# Patient Record
Sex: Female | Born: 1980
Health system: Southern US, Community
[De-identification: ages and names within clinical notes are randomized; demographics above are authoritative.]

## PROBLEM LIST (undated history)

## (undated) DIAGNOSIS — B009 Herpesviral infection, unspecified: Secondary | ICD-10-CM

## (undated) DIAGNOSIS — L309 Dermatitis, unspecified: Secondary | ICD-10-CM

## (undated) DIAGNOSIS — B999 Unspecified infectious disease: Secondary | ICD-10-CM

## (undated) DIAGNOSIS — Z9851 Tubal ligation status: Secondary | ICD-10-CM

## (undated) DIAGNOSIS — Z3483 Encounter for supervision of other normal pregnancy, third trimester: Secondary | ICD-10-CM

## (undated) HISTORY — PX: THERAPEUTIC ABORTION: SHX798

---

## 1998-01-13 ENCOUNTER — Ambulatory Visit (HOSPITAL_COMMUNITY): Admission: RE | Admit: 1998-01-13 | Discharge: 1998-01-13 | Payer: Self-pay | Admitting: *Deleted

## 1998-06-01 ENCOUNTER — Inpatient Hospital Stay (HOSPITAL_COMMUNITY): Admission: AD | Admit: 1998-06-01 | Discharge: 1998-06-03 | Payer: Self-pay | Admitting: Obstetrics and Gynecology

## 1999-01-28 ENCOUNTER — Emergency Department (HOSPITAL_COMMUNITY): Admission: EM | Admit: 1999-01-28 | Discharge: 1999-01-28 | Payer: Self-pay | Admitting: Emergency Medicine

## 2000-02-17 ENCOUNTER — Emergency Department (HOSPITAL_COMMUNITY): Admission: EM | Admit: 2000-02-17 | Discharge: 2000-02-17 | Payer: Self-pay

## 2000-04-28 ENCOUNTER — Encounter (INDEPENDENT_AMBULATORY_CARE_PROVIDER_SITE_OTHER): Payer: Self-pay

## 2000-04-28 ENCOUNTER — Other Ambulatory Visit: Admission: RE | Admit: 2000-04-28 | Discharge: 2000-04-28 | Payer: Self-pay | Admitting: Obstetrics

## 2001-11-20 ENCOUNTER — Emergency Department (HOSPITAL_COMMUNITY): Admission: EM | Admit: 2001-11-20 | Discharge: 2001-11-20 | Payer: Self-pay | Admitting: Emergency Medicine

## 2001-12-25 ENCOUNTER — Emergency Department (HOSPITAL_COMMUNITY): Admission: EM | Admit: 2001-12-25 | Discharge: 2001-12-25 | Payer: Self-pay | Admitting: Emergency Medicine

## 2001-12-25 ENCOUNTER — Encounter: Payer: Self-pay | Admitting: Emergency Medicine

## 2001-12-29 ENCOUNTER — Emergency Department (HOSPITAL_COMMUNITY): Admission: EM | Admit: 2001-12-29 | Discharge: 2001-12-29 | Payer: Self-pay | Admitting: Emergency Medicine

## 2003-06-02 ENCOUNTER — Inpatient Hospital Stay (HOSPITAL_COMMUNITY): Admission: AD | Admit: 2003-06-02 | Discharge: 2003-06-02 | Payer: Self-pay | Admitting: Obstetrics & Gynecology

## 2003-08-14 ENCOUNTER — Inpatient Hospital Stay (HOSPITAL_COMMUNITY): Admission: AD | Admit: 2003-08-14 | Discharge: 2003-08-14 | Payer: Self-pay | Admitting: *Deleted

## 2004-08-06 ENCOUNTER — Other Ambulatory Visit: Admission: RE | Admit: 2004-08-06 | Discharge: 2004-08-06 | Payer: Self-pay | Admitting: Obstetrics and Gynecology

## 2004-11-27 ENCOUNTER — Ambulatory Visit (HOSPITAL_COMMUNITY): Admission: RE | Admit: 2004-11-27 | Discharge: 2004-11-27 | Payer: Self-pay | Admitting: Obstetrics and Gynecology

## 2004-11-29 ENCOUNTER — Inpatient Hospital Stay (HOSPITAL_COMMUNITY): Admission: AD | Admit: 2004-11-29 | Discharge: 2004-11-29 | Payer: Self-pay | Admitting: Obstetrics and Gynecology

## 2004-12-02 ENCOUNTER — Inpatient Hospital Stay: Admission: AD | Admit: 2004-12-02 | Discharge: 2004-12-02 | Payer: Self-pay | Admitting: Obstetrics and Gynecology

## 2005-01-05 ENCOUNTER — Inpatient Hospital Stay (HOSPITAL_COMMUNITY): Admission: AD | Admit: 2005-01-05 | Discharge: 2005-01-05 | Payer: Self-pay | Admitting: Obstetrics and Gynecology

## 2005-04-11 ENCOUNTER — Inpatient Hospital Stay (HOSPITAL_COMMUNITY): Admission: AD | Admit: 2005-04-11 | Discharge: 2005-04-14 | Payer: Self-pay | Admitting: Obstetrics & Gynecology

## 2005-10-09 ENCOUNTER — Emergency Department (HOSPITAL_COMMUNITY): Admission: EM | Admit: 2005-10-09 | Discharge: 2005-10-09 | Payer: Self-pay | Admitting: Family Medicine

## 2006-11-17 IMAGING — US US OB COMP +14 WK
1 series · 13 of 28 positions shown · non-contrast
Comparison: none

CLINICAL DATA: 19 week 0 day gestational age by LMP.  Evaluate dating and anatomy.

[Series 1: us ob comp +14 wk · 0.31mm/px · 13 of 80 slices shown]
[im 3/80]
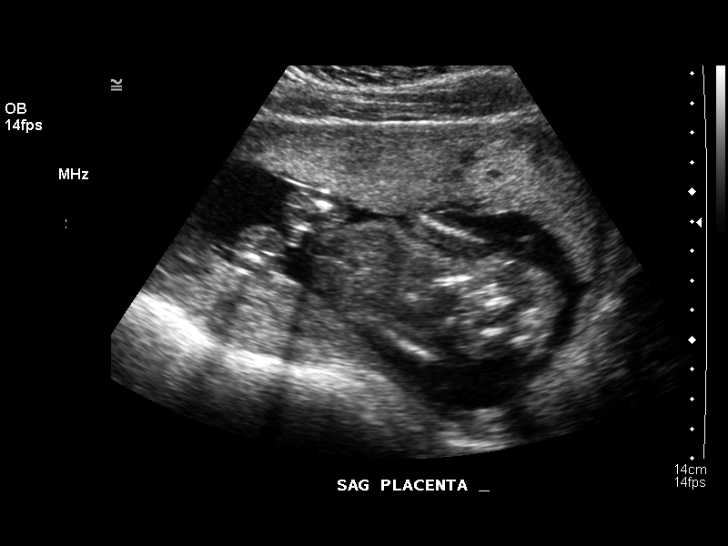
[im 9/80]
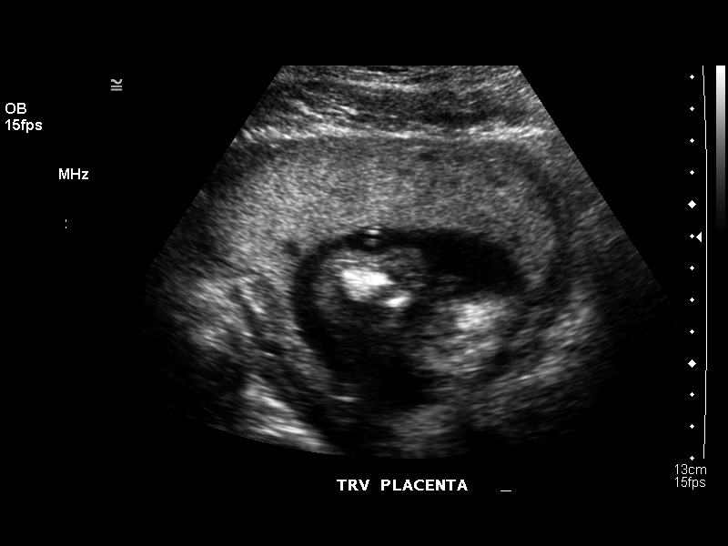
[im 15/80]
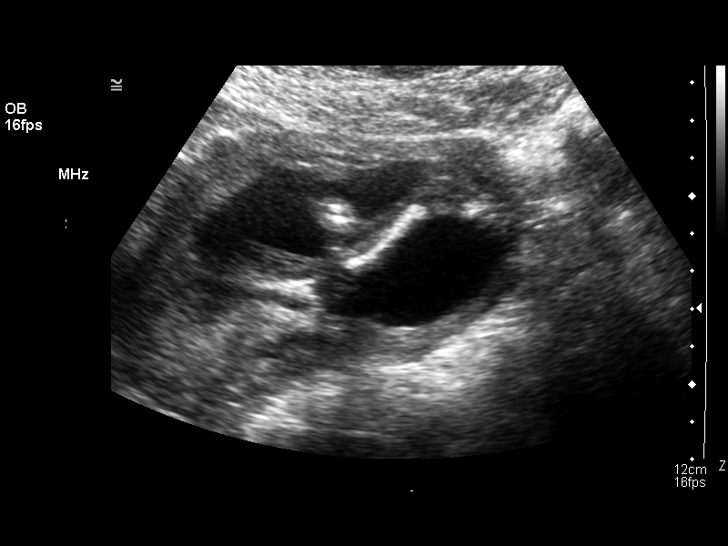
[im 21/80]
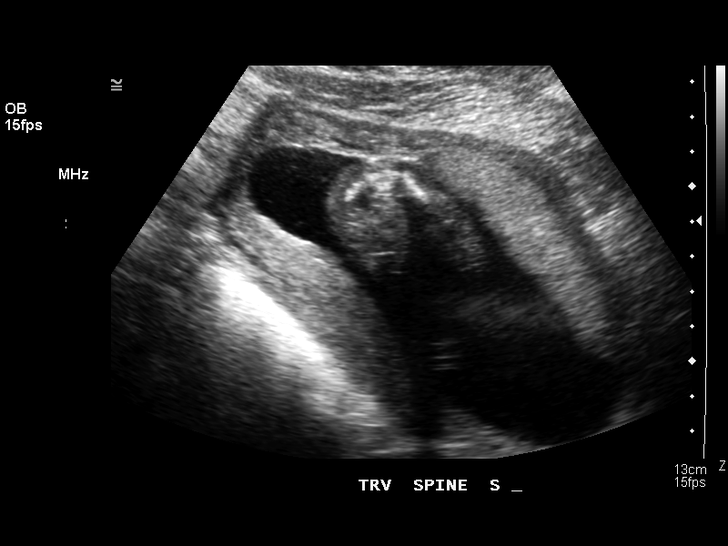
[im 27/80]
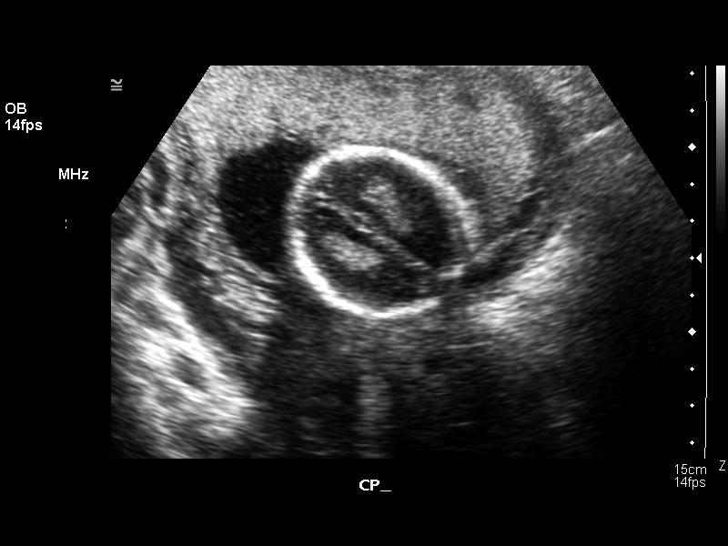
[im 33/80]
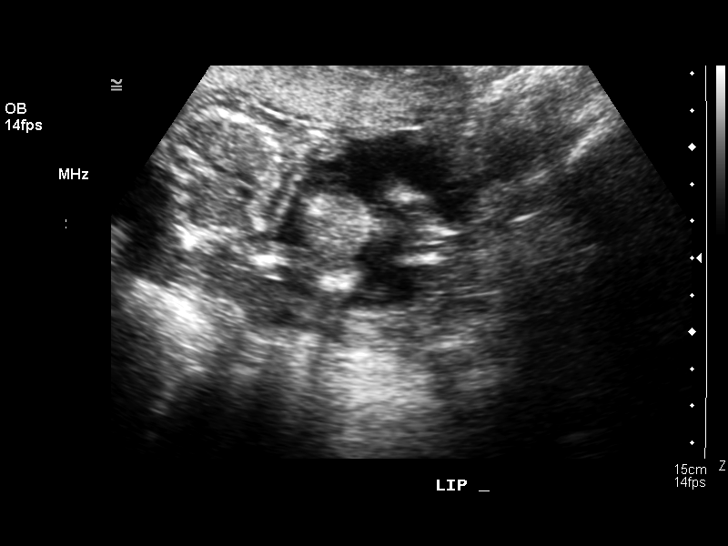
[im 41/80]
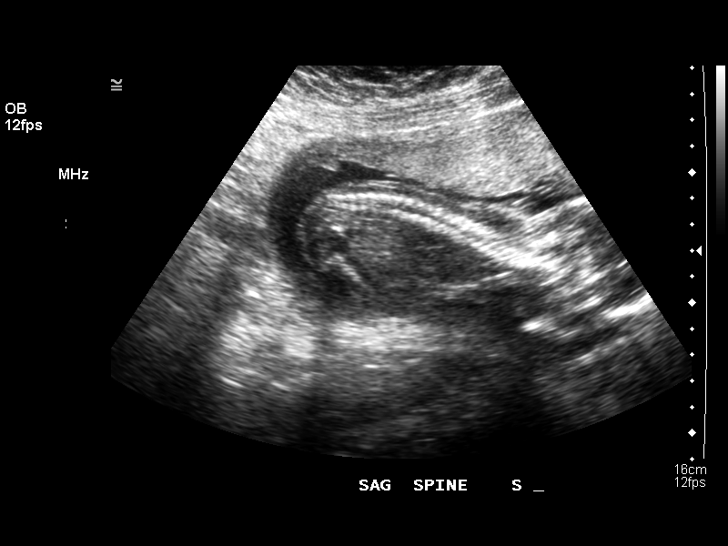
[im 47/80]
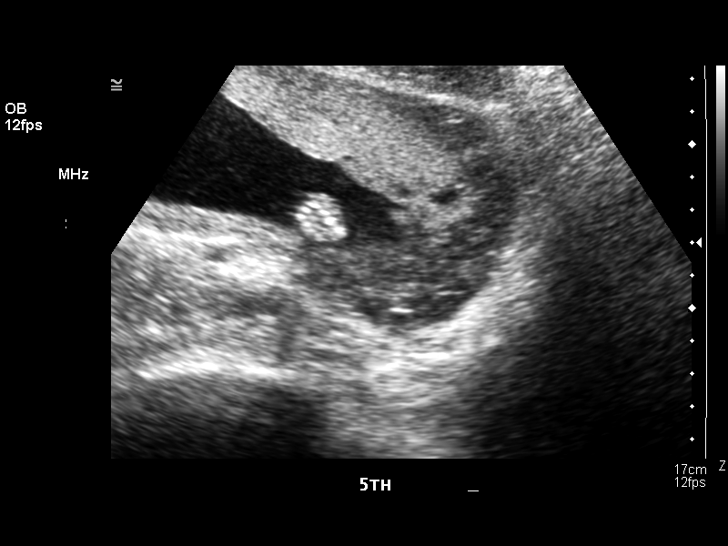
[im 53/80]
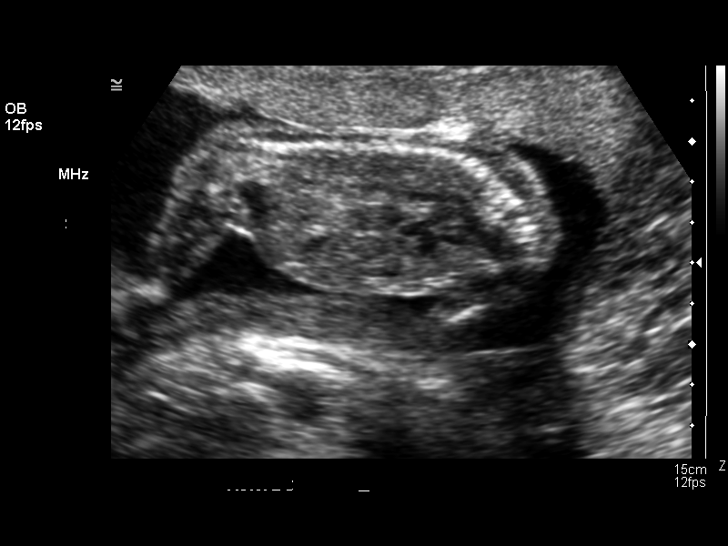
[im 59/80]
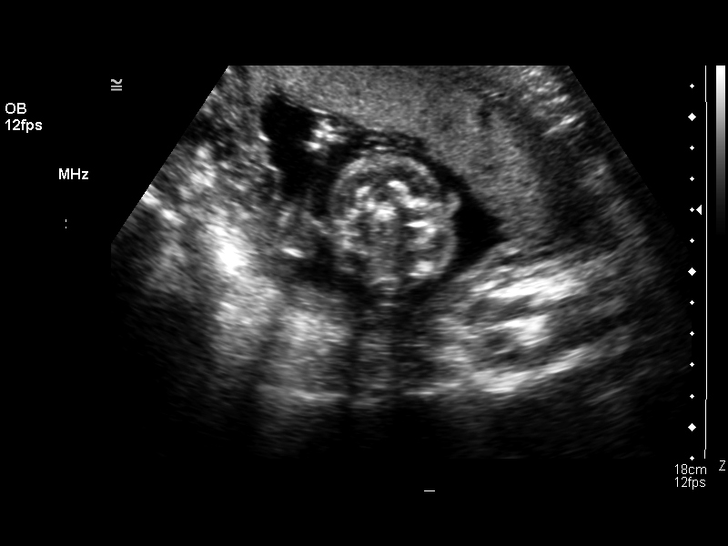
[im 65/80]
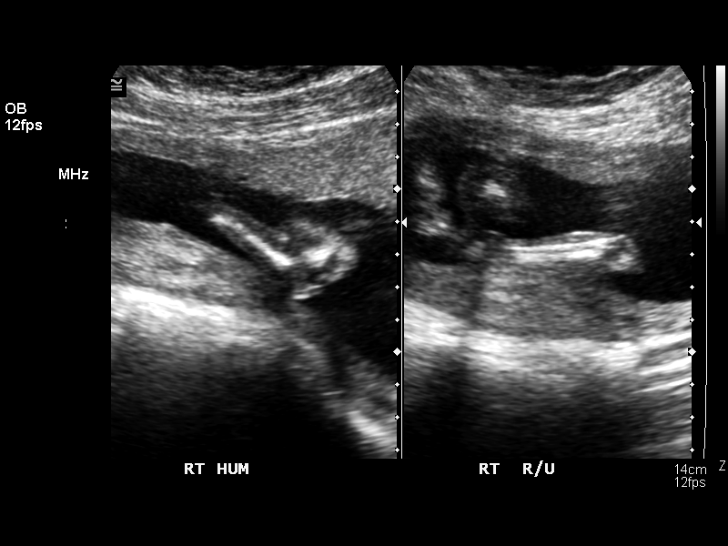
[im 71/80]
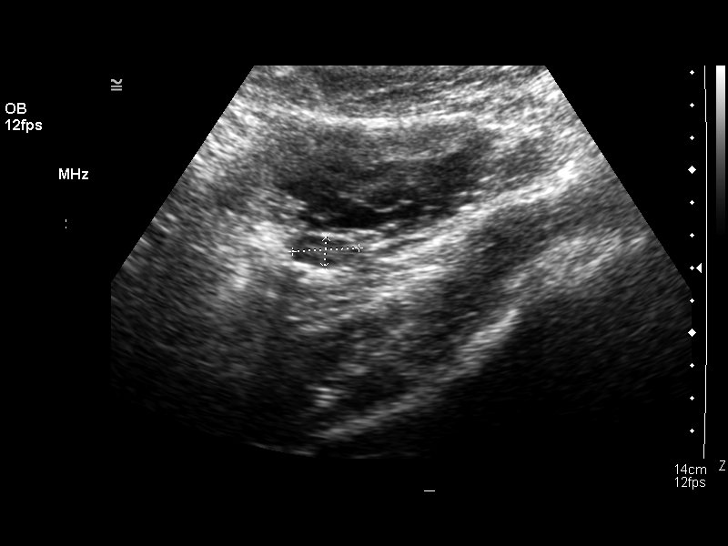
[im 77/80]
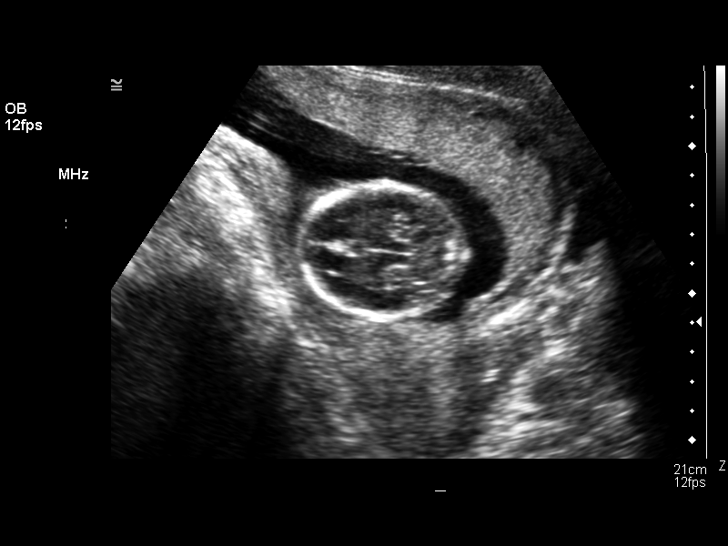

[13 of 28 positions shown; findings below may reference images not displayed]

OBSTETRICAL ULTRASOUND:
 Number of Fetuses: 1
 Heart Rate:  139
 Movement:  Yes
 Breathing:  No  
 Presentation:  Cephalic
 Placental Location:  Anterior
 Grade:  I
 Previa:  No
 Amniotic Fluid (Subjective):  Normal
 Amniotic Fluid (Objective):   4.2 cm Vertical pocket 

 FETAL BIOMETRY
 BPD:   4.4 cm  19 w 3 d
 HC:   16.4 cm   19 w 1 d
 AC:   14.0 cm  19 w 1 d
 FL:    3.0 cm  19 w 2 d

 MEAN GA:  19 w 2 d

 FETAL ANATOMY
 Lateral Ventricles:    Visualized 
 Thalami/CSP:      Visualized 
 Posterior Fossa:  Visualized 
 Nuchal Region:    Visualized 
 Spine:      Visualized 
 4 Chamber Heart on Left:      Visualized 
 Stomach on Left:      Visualized 
 3 Vessel Cord:    Visualized 
 Cord Insertion site:    Visualized 
 Kidneys:  Visualized 
 Bladder:  Visualized 
 Extremities:      Visualized 

 ADDITIONAL ANATOMY VISUALIZED:  LVOT, upper lip, orbits, diaphragm, heel, 5th digit, ductal arch, aortic arch, and male genitalia.  

 Evaluation limited by:  Fetal position.

 MATERNAL UTERINE AND ADNEXAL FINDINGS
 Cervix:   4.1 cm Transabdominally.  
 Both ovaries are visualized and are normal in appearance.
IMPRESSION: 1.  Single living intrauterine fetus with mean gestational age of 19 weeks 2 days and sonographic EDC of 04/21/05.  This correlates well with stated LMP.  
 2.  No evidence of fetal anatomic abnormality.

 </u12:p>

## 2007-06-22 ENCOUNTER — Other Ambulatory Visit: Admission: RE | Admit: 2007-06-22 | Discharge: 2007-06-22 | Payer: Self-pay | Admitting: Obstetrics and Gynecology

## 2007-09-25 ENCOUNTER — Emergency Department (HOSPITAL_COMMUNITY): Admission: EM | Admit: 2007-09-25 | Discharge: 2007-09-25 | Payer: Self-pay | Admitting: Emergency Medicine

## 2008-05-19 ENCOUNTER — Emergency Department (HOSPITAL_COMMUNITY): Admission: EM | Admit: 2008-05-19 | Discharge: 2008-05-19 | Payer: Self-pay | Admitting: Family Medicine

## 2008-06-22 ENCOUNTER — Other Ambulatory Visit: Admission: RE | Admit: 2008-06-22 | Discharge: 2008-06-22 | Payer: Self-pay | Admitting: Obstetrics and Gynecology

## 2008-08-12 ENCOUNTER — Emergency Department (HOSPITAL_COMMUNITY): Admission: EM | Admit: 2008-08-12 | Discharge: 2008-08-12 | Payer: Self-pay | Admitting: Family Medicine

## 2009-10-12 ENCOUNTER — Other Ambulatory Visit: Admission: RE | Admit: 2009-10-12 | Discharge: 2009-10-12 | Payer: Self-pay | Admitting: Obstetrics and Gynecology

## 2011-01-11 ENCOUNTER — Inpatient Hospital Stay (HOSPITAL_COMMUNITY)
Admission: AD | Admit: 2011-01-11 | Discharge: 2011-01-13 | DRG: 775 | Disposition: A | Discharge status: Home / Self Care | Payer: Medicaid Other | Source: Ambulatory Visit | Attending: Obstetrics and Gynecology | Admitting: Obstetrics and Gynecology

## 2011-01-11 LAB — CBC
HCT: 38 % (ref 36.0–46.0)
Hemoglobin: 12.4 g/dL (ref 12.0–15.0)
MCH: 28.8 pg (ref 26.0–34.0)
MCHC: 32.6 g/dL (ref 30.0–36.0)
MCV: 88.4 fL (ref 78.0–100.0)
Platelets: 155 10*3/uL (ref 150–400)
RBC: 4.3 MIL/uL (ref 3.87–5.11)
RDW: 14.2 % (ref 11.5–15.5)
WBC: 11.1 10*3/uL — ABNORMAL HIGH (ref 4.0–10.5)

## 2011-01-11 LAB — RPR: RPR Ser Ql: NONREACTIVE

## 2011-01-12 LAB — CBC
HCT: 33.9 % — ABNORMAL LOW (ref 36.0–46.0)
Hemoglobin: 10.8 g/dL — ABNORMAL LOW (ref 12.0–15.0)
MCH: 28.3 pg (ref 26.0–34.0)
MCHC: 31.9 g/dL (ref 30.0–36.0)
MCV: 89 fL (ref 78.0–100.0)
Platelets: 130 10*3/uL — ABNORMAL LOW (ref 150–400)
RBC: 3.81 MIL/uL — ABNORMAL LOW (ref 3.87–5.11)
RDW: 14.2 % (ref 11.5–15.5)
WBC: 9.9 10*3/uL (ref 4.0–10.5)

## 2011-01-17 ENCOUNTER — Inpatient Hospital Stay (HOSPITAL_COMMUNITY): Admission: AD | Admit: 2011-01-17 | Payer: Self-pay | Source: Home / Self Care | Admitting: Obstetrics and Gynecology

## 2011-01-22 NOTE — Discharge Summary (Signed)
  NAMEESTEFANA, Sonya Phillips               ACCOUNT NO.:  [id number]  MEDICAL RECORD NO.:  [id number]           PATIENT TYPE:  I  LOCATION:  9109                          FACILITY:  [facility]  PHYSICIAN:  Huel Cote, M.D. DATE OF BIRTH:  August 07, 1981  DATE OF ADMISSION:  01/11/2011 DATE OF DISCHARGE:  01/13/2011                              DISCHARGE SUMMARY   DISCHARGE DIAGNOSES: 1. Term pregnancy at 39 weeks, delivered. 2. Status post normal spontaneous vaginal delivery.  DISCHARGE MEDICATIONS: 1. Motrin 600 mg p.o. every 6 hours. 2. Percocet 1-2 tablets p.o. every 4 hours p.r.n.  DISCHARGE FOLLOWUP:  The patient is to follow up in the office in 6 weeks for her full postpartum exam.  HOSPITAL COURSE:  The patient is a 30 year old, G5, P 2-0-2-2, who was admitted at 75 weeks' gestation with rupture of membranes and some contractions.  Her prenatal care had been relatively uncomplicated.  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  She also had an episode of what was presumed to be Bell palsy from about 35 weeks to just in the last week.  This has began to clear.  She was given appointments with Neurology, but never made it to see them.  Her symptoms have now resolved.  PAST SURGICAL HISTORY:  D&E.  PAST MEDICAL HISTORY:  Bell palsy.  PAST GYNECOLOGICAL HISTORY:  No abnormal Pap smears.  Type 2 HSV.  MEDICATIONS:  Valtrex.  ALLERGIES:  None.  The patient is a smoker, but quit with pregnancy.  She uses no alcohol or drugs.  She has family history of diabetes in her father and paternal grandmother.  PRENATAL LABORATORY DATA:  AB positive.  Antibody screen negative. Rubella immune.  RPR nonreactive.  Hepatitis B surface antigen negative. HIV negative.  GC negative.  Chlamydia negative.  Alpha-fetoprotein normal.  Glucola 85.  Group B strep negative.  The patient was augmented with  Pitocin and progressed well reaching complete dilation and pushed with a normal spontaneous vaginal delivery of a vigorous female infant over an intact perineum.  Apgars were 8 and 9.  Weight was 7 pounds and 8 ounces.  There was nuchal x1 which was reduced with delivery.  Cervix, rectum, and vagina were all intact.  She was admitted for routine postpartum care.  On postpartum day #1, hemoglobin was 10.8.  On postpartum day #2, she was feeling well other than being tired and was felt stable for discharge home.  She was given instructions on pelvic rest and will follow up in the office in 6 weeks.     Huel Cote, M.D.     KR/MEDQ  D:  01/13/2011  T:  01/13/2011  Job:  [id number]  Electronically Signed by Huel Cote M.D. on 01/22/2011 08:46:58 AM

## 2011-08-26 LAB — POCT URINALYSIS DIP (DEVICE)
Bilirubin Urine: NEGATIVE
Glucose, UA: NEGATIVE
Ketones, ur: NEGATIVE
Operator id: 239701
pH: >=9

## 2011-08-26 LAB — POCT PREGNANCY, URINE: Preg Test, Ur: NEGATIVE

## 2013-10-01 ENCOUNTER — Inpatient Hospital Stay (HOSPITAL_COMMUNITY): Payer: Medicaid Other | Admitting: Anesthesiology

## 2013-10-01 ENCOUNTER — Encounter (HOSPITAL_COMMUNITY): Payer: Self-pay | Admitting: *Deleted

## 2013-10-01 ENCOUNTER — Inpatient Hospital Stay (HOSPITAL_COMMUNITY)
Admission: AD | Admit: 2013-10-01 | Discharge: 2013-10-01 | Disposition: A | Discharge status: Home / Self Care | Payer: Medicaid Other | Source: Ambulatory Visit | Attending: Obstetrics and Gynecology | Admitting: Obstetrics and Gynecology

## 2013-10-01 ENCOUNTER — Encounter (HOSPITAL_COMMUNITY)
Admission: AD | Disposition: A | Discharge status: Home / Self Care | Payer: Self-pay | Source: Ambulatory Visit | Attending: Obstetrics and Gynecology

## 2013-10-01 ENCOUNTER — Encounter (HOSPITAL_COMMUNITY): Payer: Medicaid Other | Admitting: Anesthesiology

## 2013-10-01 DIAGNOSIS — O034 Incomplete spontaneous abortion without complication: Secondary | ICD-10-CM | POA: Insufficient documentation

## 2013-10-01 DIAGNOSIS — D649 Anemia, unspecified: Secondary | ICD-10-CM | POA: Insufficient documentation

## 2013-10-01 HISTORY — PX: DILATION AND EVACUATION: SHX1459

## 2013-10-01 HISTORY — DX: Dermatitis, unspecified: L30.9

## 2013-10-01 HISTORY — DX: Unspecified infectious disease: B99.9

## 2013-10-01 LAB — GLUCOSE, CAPILLARY: Glucose-Capillary: 91 mg/dL (ref 70–99)

## 2013-10-01 LAB — CBC
HCT: 33.2 % — ABNORMAL LOW (ref 36.0–46.0)
Hemoglobin: 10.9 g/dL — ABNORMAL LOW (ref 12.0–15.0)
RBC: 3.77 MIL/uL — ABNORMAL LOW (ref 3.87–5.11)
WBC: 7.6 10*3/uL (ref 4.0–10.5)

## 2013-10-01 LAB — ABO/RH: ABO/RH(D): AB POS

## 2013-10-01 SURGERY — DILATION AND EVACUATION, UTERUS
Anesthesia: Monitor Anesthesia Care | Site: Vagina | Wound class: Clean Contaminated

## 2013-10-01 MED ORDER — CITRIC ACID-SODIUM CITRATE 334-500 MG/5ML PO SOLN
30.0000 mL | Freq: Once | ORAL | Status: AC
  Administered 2013-10-01: 30 mL via ORAL
  Filled 2013-10-01: qty 15

## 2013-10-01 MED ORDER — FENTANYL CITRATE 0.05 MG/ML IJ SOLN
INTRAMUSCULAR | Status: DC | PRN
  Administered 2013-10-01: 100 ug via INTRAVENOUS

## 2013-10-01 MED ORDER — KETOROLAC TROMETHAMINE 60 MG/2ML IM SOLN
60.0000 mg | Freq: Once | INTRAMUSCULAR | Status: AC
  Administered 2013-10-01: 60 mg via INTRAMUSCULAR
  Filled 2013-10-01: qty 2

## 2013-10-01 MED ORDER — FERROUS SULFATE 325 (65 FE) MG PO TABS: 325.0000 mg | ORAL_TABLET | Freq: Two times a day (BID) | ORAL | Status: DC

## 2013-10-01 MED ORDER — LIDOCAINE HCL 1 % IJ SOLN
INTRAMUSCULAR | Status: DC | PRN
  Administered 2013-10-01: 10 mL

## 2013-10-01 MED ORDER — FENTANYL CITRATE 0.05 MG/ML IJ SOLN
INTRAMUSCULAR | Status: AC
  Filled 2013-10-01: qty 2

## 2013-10-01 MED ORDER — MIDAZOLAM HCL 2 MG/2ML IJ SOLN
INTRAMUSCULAR | Status: AC
  Filled 2013-10-01: qty 2

## 2013-10-01 MED ORDER — LIDOCAINE HCL (CARDIAC) 20 MG/ML IV SOLN
INTRAVENOUS | Status: DC | PRN
  Administered 2013-10-01: 50 mg via INTRAVENOUS

## 2013-10-01 MED ORDER — LIDOCAINE HCL 1 % IJ SOLN
INTRAMUSCULAR | Status: AC
  Filled 2013-10-01: qty 20

## 2013-10-01 MED ORDER — IBUPROFEN 600 MG PO TABS: 600.0000 mg | ORAL_TABLET | Freq: Four times a day (QID) | ORAL | Status: DC | PRN

## 2013-10-01 MED ORDER — PROPOFOL 10 MG/ML IV EMUL
INTRAVENOUS | Status: DC | PRN
  Administered 2013-10-01 (×7): 20 mg via INTRAVENOUS
  Administered 2013-10-01: 30 mg via INTRAVENOUS
  Administered 2013-10-01 (×2): 50 mg via INTRAVENOUS

## 2013-10-01 MED ORDER — LACTATED RINGERS IV SOLN
INTRAVENOUS | Status: DC
  Administered 2013-10-01 (×3): via INTRAVENOUS

## 2013-10-01 MED ORDER — FAMOTIDINE IN NACL 20-0.9 MG/50ML-% IV SOLN
20.0000 mg | Freq: Once | INTRAVENOUS | Status: AC
  Administered 2013-10-01 (×2): 20 mg via INTRAVENOUS
  Filled 2013-10-01: qty 50

## 2013-10-01 MED ORDER — PROPOFOL 10 MG/ML IV EMUL
INTRAVENOUS | Status: AC
  Filled 2013-10-01: qty 40

## 2013-10-01 MED ORDER — MIDAZOLAM HCL 2 MG/2ML IJ SOLN
INTRAMUSCULAR | Status: DC | PRN
  Administered 2013-10-01: 2 mg via INTRAVENOUS

## 2013-10-01 MED ORDER — ONDANSETRON HCL 4 MG/2ML IJ SOLN
INTRAMUSCULAR | Status: AC
  Filled 2013-10-01: qty 2

## 2013-10-01 MED ORDER — ONDANSETRON HCL 4 MG/2ML IJ SOLN
INTRAMUSCULAR | Status: DC | PRN
  Administered 2013-10-01: 4 mg via INTRAVENOUS

## 2013-10-01 MED ORDER — LIDOCAINE HCL (CARDIAC) 20 MG/ML IV SOLN
INTRAVENOUS | Status: AC
  Filled 2013-10-01: qty 5

## 2013-10-01 SURGICAL SUPPLY — 21 items
CATH ROBINSON RED A/P 16FR (CATHETERS) ×2 IMPLANT
CLOTH BEACON ORANGE TIMEOUT ST (SAFETY) ×2 IMPLANT
DECANTER SPIKE VIAL GLASS SM (MISCELLANEOUS) ×2 IMPLANT
GLOVE BIO SURGEON STRL SZ7.5 (GLOVE) ×4 IMPLANT
GOWN PREVENTION PLUS XLARGE (GOWN DISPOSABLE) ×2 IMPLANT
GOWN STRL REIN XL XLG (GOWN DISPOSABLE) ×4 IMPLANT
KIT BERKELEY 1ST TRIMESTER 3/8 (MISCELLANEOUS) ×2 IMPLANT
NDL SPNL 22GX3.5 QUINCKE BK (NEEDLE) ×1 IMPLANT
NEEDLE SPNL 22GX3.5 QUINCKE BK (NEEDLE) ×2 IMPLANT
NS IRRIG 1000ML POUR BTL (IV SOLUTION) ×2 IMPLANT
PACK VAGINAL MINOR WOMEN LF (CUSTOM PROCEDURE TRAY) ×2 IMPLANT
PAD OB MATERNITY 4.3X12.25 (PERSONAL CARE ITEMS) ×2 IMPLANT
PAD PREP 24X48 CUFFED NSTRL (MISCELLANEOUS) ×2 IMPLANT
SET BERKELEY SUCTION TUBING (SUCTIONS) ×2 IMPLANT
SUT CHROMIC 2 0 SH (SUTURE) ×1 IMPLANT
SYR CONTROL 10ML LL (SYRINGE) ×2 IMPLANT
TOWEL OR 17X24 6PK STRL BLUE (TOWEL DISPOSABLE) ×4 IMPLANT
VACURETTE 10 RIGID CVD (CANNULA) IMPLANT
VACURETTE 7MM CVD STRL WRAP (CANNULA) IMPLANT
VACURETTE 8 RIGID CVD (CANNULA) IMPLANT
VACURETTE 9 RIGID CVD (CANNULA) ×1 IMPLANT

## 2013-10-01 NOTE — MAU Provider Note (Signed)
History     CSN: [id number]  Arrival date and time: 10/01/13 7829   First Provider Initiated Contact with Patient 10/01/13 0818      Chief Complaint  Patient presents with  . Vaginal Bleeding  . Abdominal Pain   HPI  Sonya Phillips is a 32 y.o. year old female [phone] at [redacted]w[redacted]d  who presents with abdominal pain and heavy vaginal bleeding. She was diagnosed this week with a failed pregnancy; the fetus stopped growing at [redacted] weeks gestation. The patient has been bleeding for 4 days; it has progressively gotten heavier. Last night she began passing large clots, and soaking through more than one pad an hour.  She is having mild abdominal cramping; requests pain medication at this time.   OB History   Grav Para Term Preterm Abortions TAB SAB Ect Mult Living   5 3 3  0 1 1 0 0 0 3      Past Medical History  Diagnosis Date  . Infection     UTI  . Eczema     Past Surgical History  Procedure Laterality Date  . Therapeutic abortion      Family History  Problem Relation Age of Onset  . Diabetes Father   . Cancer Paternal Uncle   . Diabetes Paternal Grandmother   . Hearing loss Neg Hx     History  Substance Use Topics  . Smoking status: Former Games developer  . Smokeless tobacco: Never Used     Comment: with preg  . Alcohol Use: No    Allergies: No Known Allergies  Prescriptions prior to admission  Medication Sig Dispense Refill  . hydrocortisone 2.5 % cream Apply 1 application topically 2 (two) times daily.      . Prenatal Vit-Fe Fumarate-FA (PRENATAL MULTIVITAMIN) TABS tablet Take 1 tablet by mouth daily at 12 noon.       Results for orders placed during the hospital encounter of 10/01/13 (from the past 24 hour(s))  CBC     Status: Abnormal   Collection Time    10/01/13  8:30 AM      Result Value Range   WBC 7.6  4.0 - 10.5 K/uL   RBC 3.77 (*) 3.87 - 5.11 MIL/uL   Hemoglobin 10.9 (*) 12.0 - 15.0 g/dL   HCT 65.7 (*) 84.6 - 96.2 %   MCV 88.1  78.0 - 100.0 fL   MCH  28.9  26.0 - 34.0 pg   MCHC 32.8  30.0 - 36.0 g/dL   RDW 95.2  84.1 - 32.4 %   Platelets 188  150 - 400 K/uL  ABO/RH     Status: None   Collection Time    10/01/13  8:30 AM      Result Value Range   ABO/RH(D) AB POS    GLUCOSE, CAPILLARY     Status: None   Collection Time    10/01/13 11:44 AM      Result Value Range   Glucose-Capillary 91  70 - 99 mg/dL   Comment 1 Notify RN      Review of Systems  Constitutional: Negative for fever and chills.  Gastrointestinal: Positive for abdominal pain. Negative for nausea, vomiting, diarrhea and constipation.  Genitourinary: Negative for dysuria, urgency, frequency and hematuria.  Musculoskeletal: Negative for back pain.  Neurological: Negative for headaches.   Physical Exam   Blood pressure 116/76, pulse 109, temperature 99.1 F (37.3 C), temperature source Oral, resp. rate 18, last menstrual period 07/19/2013.  Physical Exam  Constitutional: She is oriented to person, place, and time. She appears well-developed and well-nourished. No distress.  HENT:  Head: Normocephalic.  Eyes: Pupils are equal, round, and reactive to light.  Neck: Neck supple.  Respiratory: Effort normal.  Genitourinary:  Speculum exam: Vagina - Large amount of bright red blood in vaginal canal, many small dime size clots.  Cervix - Large amount of active bleeding from cervix.  Bimanual exam: Cervix 1 cm, anterior.  Uterus non tender, normal size Adnexa non tender, no masses bilaterally Chaperone present for exam.   Neurological: She is alert and oriented to person, place, and time.  Skin: Skin is warm and dry. She is not diaphoretic. There is pallor.    MAU Course  Procedures None  MDM CBC ABO/RH  IV LR  AB positive blood type    Consulted with Dr. Ambrose Mantle who would like for the patient to be observed for bleeding in MAU for approximately 1 hour to determine if patient needs a D/C.  1000: reevaluated patients vaginal bleeding, pt saturated small  peri pad within one hour. Pt continues to have active bleeding from the vagina. Dr. Ambrose Mantle updated, and will plan to prepare pt for D/C.   Assessment and Plan  A: Incomplete SAB Heavy vaginal bleeding   P: Pt to the OR for a D/C per Dr. Ambrose Mantle.   RASCH, JENNIFER IRENE FNP-C  10/01/2013, 8:18 AM

## 2013-10-01 NOTE — Anesthesia Preprocedure Evaluation (Signed)
Anesthesia Evaluation  Patient identified by MRN, date of birth, ID band Patient awake    Reviewed: Allergy & Precautions, H&P , NPO status , Patient's Chart, lab work & pertinent test results  Airway Mallampati: I TM Distance: >3 FB Neck ROM: full    Dental no notable dental hx. (+) Teeth Intact   Pulmonary neg pulmonary ROS,          Cardiovascular negative cardio ROS  Rate:Tachycardia     Neuro/Psych negative neurological ROS  negative psych ROS   GI/Hepatic negative GI ROS, Neg liver ROS,   Endo/Other  negative endocrine ROS  Renal/GU negative Renal ROS  negative genitourinary   Musculoskeletal negative musculoskeletal ROS (+)   Abdominal Normal abdominal exam  (+)   Peds  Hematology negative hematology ROS (+)   Anesthesia Other Findings   Reproductive/Obstetrics negative OB ROS                           Anesthesia Physical Anesthesia Plan  ASA: II  Anesthesia Plan: MAC   Post-op Pain Management:    Induction:   Airway Management Planned:   Additional Equipment:   Intra-op Plan:   Post-operative Plan:   Informed Consent: I have reviewed the patients History and Physical, chart, labs and discussed the procedure including the risks, benefits and alternatives for the proposed anesthesia with the patient or authorized representative who has indicated his/her understanding and acceptance.     Plan Discussed with: CRNA and Surgeon  Anesthesia Plan Comments:         Anesthesia Quick Evaluation

## 2013-10-01 NOTE — MAU Note (Signed)
Patient states she was seen in the office earlier this week and diagnosed with a failed pregnancy. States she started having bleeding and cramping last night. States she has soaked 2 pads an hour this am with clots.

## 2013-10-01 NOTE — Transfer of Care (Signed)
Immediate Anesthesia Transfer of Care Note  Patient: Sonya Phillips  Procedure(s) Performed: Procedure(s): DILATATION AND EVACUATION (N/A)  Patient Location: PACU  Anesthesia Type:MAC  Level of Consciousness: awake  Airway & Oxygen Therapy: Patient Spontanous Breathing  Post-op Assessment: Report given to PACU RN and Post -op Vital signs reviewed and stable  Post vital signs: Reviewed and stable  Complications: No apparent anesthesia complications

## 2013-10-01 NOTE — Progress Notes (Signed)
Patient ID: Sonya Phillips, female   DOB: Mar 03, 1981, 32 y.o.   MRN: [id number] The H&P done by the NP has been reviewed. I will do a D&C to stop her bleeding.

## 2013-10-01 NOTE — MAU Note (Signed)
Had been spotting last week and a half. Became red on Tues, went to dr and was dx with failed preg.   Became heavy last night.

## 2013-10-01 NOTE — Anesthesia Postprocedure Evaluation (Signed)
Anesthesia Post Note  Patient: Sonya Phillips  Procedure(s) Performed: Procedure(s) (LRB): DILATATION AND EVACUATION (N/A)  Anesthesia type: MAC  Patient location: PACU  Post pain: Pain level controlled  Post assessment: Post-op Vital signs reviewed  Last Vitals:  Filed Vitals:   10/01/13 1245  BP: 103/61  Pulse: 89  Temp:   Resp: 20    Post vital signs: Reviewed  Level of consciousness: sedated  Complications: No apparent anesthesia complications

## 2013-10-01 NOTE — Addendum Note (Signed)
Addendum created 10/01/13 1353 by Shanon Payor, CRNA   Modules edited: Anesthesia Flowsheet

## 2013-10-02 NOTE — Op Note (Signed)
Sonya Phillips, Sonya Phillips               ACCOUNT NO.:  [id number]  MEDICAL RECORD NO.:  [id number]  LOCATION:  [facility]                          FACILITY:  [facility]  PHYSICIAN:  Malachi Pro. Ambrose Mantle, M.D. DATE OF BIRTH:  [birth year]  DATE OF PROCEDURE: DATE OF DISCHARGE:  10/01/2013                              OPERATIVE REPORT   PREOPERATIVE DIAGNOSIS:  Incomplete abortion with hemorrhage and anemia.  POSTOPERATIVE DIAGNOSIS:  Incomplete abortion with hemorrhage and anemia.  PROCEDURE:  Suction D and C.  SURGEON:  Malachi Pro. Ambrose Mantle, M.D.  ANESTHESIA:  MAC anesthesia with 10 mL of 1% Xylocaine at 4 o'clock and 8 o'clock on the cervicovaginal junction.  DESCRIPTION OF PROCEDURE:  The patient was brought to the operating room, placed under MAC anesthesia and placed in lithotomy position. Exam revealed the cervix to be slightly open.  There were blood clots in the vagina.  The uterus was mid plane, upper limit of normal size.  The adnexa were free of masses.  A time-out was done, then the vulva, vagina, and perineum were prepped with Betadine solution.  The urethra was prepped.  The bladder was emptied with a [place] catheter.  The cervix was exposed with a Graves speculum.  Cervix was grasped with a tenaculum on the anterior cervix.  The uterus sounded to 9.5 cm slightly anteriorly, it was dilated easily up so I could easily admit a #9 curved suction curette.  I curetted the endometrial cavity until I felt like all the tissue had been removed.  I then did a sharp curette to make sure that the cavity felt smooth.  There was no more tissue present. The tenaculum bleeding sites were controlled with 2-0 chromic suture. There was no more bleeding, the uterus was massaged, and the procedure was terminated.  The patient was returned to recovery in satisfactory condition. Sponge and needle counts correct.  Blood loss probably 30 mL.     Malachi Pro. Ambrose Mantle, M.D.     TFH/MEDQ  D:  10/01/2013  T:   10/02/2013  Job:  [id number]

## 2013-10-04 ENCOUNTER — Encounter (HOSPITAL_COMMUNITY): Payer: Self-pay | Admitting: Obstetrics and Gynecology

## 2013-11-25 NOTE — L&D Delivery Note (Signed)
Delivery Note At 6:06 PM a viable and healthy female was delivered via Vaginal, Spontaneous Delivery (Presentation: Right Occiput Anterior).  APGAR: P, weight P.   Placenta status: Intact, Spontaneous.  Cord:  3VC with the following complications: nuchal.    Anesthesia:  epidural Episiotomy: none  Lacerations: R labial lac Suture Repair: N/A Est. Blood Loss (mL): 400cc  Mom to postpartum.  Baby to Couplet care / Skin to Skin.  [name], Sonya Phillips 08/31/2014, 6:18 PM  Br/ AB+/RI/Tdap in PNC/OCP (POP)

## 2014-02-02 LAB — OB RESULTS CONSOLE GC/CHLAMYDIA
Chlamydia: NEGATIVE
Chlamydia: NEGATIVE
GC PROBE AMP, GENITAL: NEGATIVE
Gonorrhea: NEGATIVE

## 2014-02-02 LAB — OB RESULTS CONSOLE HIV ANTIBODY (ROUTINE TESTING)
HIV: NONREACTIVE
HIV: NONREACTIVE

## 2014-02-02 LAB — OB RESULTS CONSOLE HEPATITIS B SURFACE ANTIGEN: Hepatitis B Surface Ag: NEGATIVE

## 2014-02-02 LAB — OB RESULTS CONSOLE RUBELLA ANTIBODY, IGM: Rubella: IMMUNE

## 2014-02-02 LAB — OB RESULTS CONSOLE GBS: GBS: NEGATIVE

## 2014-06-17 LAB — OB RESULTS CONSOLE HIV ANTIBODY (ROUTINE TESTING): HIV: NONREACTIVE

## 2014-08-06 ENCOUNTER — Encounter (HOSPITAL_COMMUNITY): Payer: Self-pay | Admitting: *Deleted

## 2014-08-12 LAB — OB RESULTS CONSOLE GBS: STREP GROUP B AG: NEGATIVE

## 2014-08-30 ENCOUNTER — Encounter (HOSPITAL_COMMUNITY): Payer: Self-pay

## 2014-08-30 DIAGNOSIS — Z3483 Encounter for supervision of other normal pregnancy, third trimester: Secondary | ICD-10-CM

## 2014-08-30 HISTORY — DX: Encounter for supervision of other normal pregnancy, third trimester: Z34.83

## 2014-08-30 NOTE — H&P (Signed)
KASSEY LAFOREST is a 33 y.o. female [phone] at 16 wk for IOL given term status and favorable cervix.  +FM, no LOF, no VB, occ ctx.  Relatively uncomplicated PNC, see PNR for complete.  Dated by LMP cw First Tri [place]    Maternal Medical History:  Contractions: Frequency: irregular.    Fetal activity: Perceived fetal activity is normal.    Prenatal Complications - Diabetes: none.    OB History   Grav Para Term Preterm Abortions TAB SAB Ect Mult Living   5 3 3  0 1 1 0 0 0 3    [id number] G1 SAB G2 SVD female 7#6 G3 TAB G4 SVD female 6#9 G5 SVD female 7#8 G6 SAB G7 present  No abn pap H/o HSV2 IGG no outbreak  Past Medical History  Diagnosis Date  . Infection     UTI  . Eczema   . Normal pregnancy in multigravida in third trimester 08/30/2014   Past Surgical History  Procedure Laterality Date  . Therapeutic abortion    . Dilation and evacuation N/A 10/01/2013    Procedure: DILATATION AND EVACUATION;  Surgeon: Bing Plume, MD;  Location: [facility];  Service: Gynecology;  Laterality: N/A;   Family History: family history includes Cancer in her paternal uncle; Diabetes in her father and paternal grandmother. There is no history of Hearing loss. Social History:  reports that she has quit smoking. She has never used smokeless tobacco. She reports that she does not drink alcohol or use illicit drugs.customer service, in relationship, single  MedsPNV All NKDA   Prenatal Transfer Tool  Maternal Diabetes: No Genetic Screening: Normal Maternal Ultrasounds/Referrals: Normal Fetal Ultrasounds or other Referrals:  None Maternal Substance Abuse:  No Significant Maternal Medications:  None Significant Maternal Lab Results:  Lab values include: Group B Strep negative Other Comments:  Panorama Low Risk, female, nl HGB electro  Review of Systems  Constitutional: Negative.   HENT: Negative.   Eyes: Negative.   Respiratory: Negative.   Cardiovascular: Negative.   Gastrointestinal:  Negative.   Genitourinary: Negative.   Musculoskeletal: Negative.   Skin: Negative.   Neurological: Negative.   Psychiatric/Behavioral: Negative.       unknown if currently breastfeeding. Maternal Exam:  Uterine Assessment: Contraction frequency is irregular.   Abdomen: Fundal height is appropriate for gestation.   Estimated fetal weight is 7-8#.   Fetal presentation: vertex  Introitus: Normal vulva. Normal vagina.  Pelvis: adequate for delivery.   Cervix: Cervix evaluated by digital exam.     Physical Exam  Constitutional: She is oriented to person, place, and time. She appears well-developed and well-nourished.  HENT:  Head: Normocephalic and atraumatic.  Cardiovascular: Normal rate and regular rhythm.   Respiratory: Effort normal and breath sounds normal. No respiratory distress. She has no wheezes.  GI: Soft. Bowel sounds are normal. She exhibits no distension. There is no tenderness.  Musculoskeletal: Normal range of motion.  Neurological: She is alert and oriented to person, place, and time.  Skin: Skin is warm and dry.  Psychiatric: She has a normal mood and affect. Her behavior is normal.    Prenatal labs: ABO, Rh: --/--/AB POS (11/07 0830) Antibody:  neg Rubella:  immune RPR:   NR HBsAg:   neg HIV:   neg GBS:   neg  Hgb 12.4/ Ur Cx neg/ GC neg/ Chl neg/glucola 128/Nl Hgb electro/Plt 233K/Panorama LowRisk - female  Tdap 06/17/14  [place] cwd LMP 8 wk Nl NT Nl anat, post plac, female  SVE 3/20/-3  Assessment/Plan: 33yo [id number] at 39 wk for IOL gbbs neg no prophylaxis Expect SVD Epidural, IV pain meds prn IOL - AROM and pitocin  [name], Elazar Argabright 08/30/2014, 9:01 PM

## 2014-08-31 ENCOUNTER — Inpatient Hospital Stay (HOSPITAL_COMMUNITY): Payer: Medicaid Other | Admitting: Anesthesiology

## 2014-08-31 ENCOUNTER — Inpatient Hospital Stay (HOSPITAL_COMMUNITY)
Admission: RE | Admit: 2014-08-31 | Discharge: 2014-09-02 | DRG: 775 | Disposition: A | Discharge status: Home / Self Care | Payer: Medicaid Other | Source: Ambulatory Visit | Attending: Obstetrics and Gynecology | Admitting: Obstetrics and Gynecology

## 2014-08-31 ENCOUNTER — Encounter (HOSPITAL_COMMUNITY): Payer: Medicaid Other | Admitting: Anesthesiology

## 2014-08-31 ENCOUNTER — Encounter (HOSPITAL_COMMUNITY): Payer: Self-pay

## 2014-08-31 VITALS — BP 109/63 | HR 84 | Temp 98.4°F | Resp 18 | Ht 68.0 in | Wt 241.0 lb

## 2014-08-31 DIAGNOSIS — Z87891 Personal history of nicotine dependence: Secondary | ICD-10-CM | POA: Diagnosis not present

## 2014-08-31 DIAGNOSIS — Z3483 Encounter for supervision of other normal pregnancy, third trimester: Secondary | ICD-10-CM | POA: Diagnosis present

## 2014-08-31 DIAGNOSIS — Z3A39 39 weeks gestation of pregnancy: Secondary | ICD-10-CM | POA: Diagnosis present

## 2014-08-31 DIAGNOSIS — Z833 Family history of diabetes mellitus: Secondary | ICD-10-CM | POA: Diagnosis not present

## 2014-08-31 DIAGNOSIS — O09523 Supervision of elderly multigravida, third trimester: Secondary | ICD-10-CM

## 2014-08-31 DIAGNOSIS — Z3490 Encounter for supervision of normal pregnancy, unspecified, unspecified trimester: Secondary | ICD-10-CM

## 2014-08-31 HISTORY — DX: Encounter for supervision of other normal pregnancy, third trimester: Z34.83

## 2014-08-31 HISTORY — DX: Herpesviral infection, unspecified: B00.9

## 2014-08-31 LAB — CBC
HEMATOCRIT: 36 % (ref 36.0–46.0)
Hemoglobin: 11.6 g/dL — ABNORMAL LOW (ref 12.0–15.0)
MCH: 27.6 pg (ref 26.0–34.0)
MCHC: 32.2 g/dL (ref 30.0–36.0)
MCV: 85.5 fL (ref 78.0–100.0)
Platelets: 145 10*3/uL — ABNORMAL LOW (ref 150–400)
RBC: 4.21 MIL/uL (ref 3.87–5.11)
RDW: 14.5 % (ref 11.5–15.5)
WBC: 9.3 10*3/uL (ref 4.0–10.5)

## 2014-08-31 LAB — TYPE AND SCREEN
ABO/RH(D): AB POS
Antibody Screen: NEGATIVE

## 2014-08-31 LAB — RPR

## 2014-08-31 MED ORDER — OXYCODONE-ACETAMINOPHEN 5-325 MG PO TABS: 2.0000 | ORAL_TABLET | ORAL | Status: DC | PRN

## 2014-08-31 MED ORDER — FENTANYL 2.5 MCG/ML BUPIVACAINE 1/10 % EPIDURAL INFUSION (WH - ANES)
INTRAMUSCULAR | Status: AC
  Administered 2014-08-31: 14 mL/h via EPIDURAL
  Filled 2014-08-31: qty 125

## 2014-08-31 MED ORDER — ONDANSETRON HCL 4 MG PO TABS: 4.0000 mg | ORAL_TABLET | ORAL | Status: DC | PRN

## 2014-08-31 MED ORDER — WITCH HAZEL-GLYCERIN EX PADS: 1.0000 "application " | MEDICATED_PAD | CUTANEOUS | Status: DC | PRN

## 2014-08-31 MED ORDER — PHENYLEPHRINE 40 MCG/ML (10ML) SYRINGE FOR IV PUSH (FOR BLOOD PRESSURE SUPPORT)
80.0000 ug | PREFILLED_SYRINGE | INTRAVENOUS | Status: DC | PRN
  Filled 2014-08-31: qty 2

## 2014-08-31 MED ORDER — LANOLIN HYDROUS EX OINT: TOPICAL_OINTMENT | CUTANEOUS | Status: DC | PRN

## 2014-08-31 MED ORDER — IBUPROFEN 600 MG PO TABS
600.0000 mg | ORAL_TABLET | Freq: Four times a day (QID) | ORAL | Status: DC
  Administered 2014-09-01 – 2014-09-02 (×6): 600 mg via ORAL
  Filled 2014-08-31 (×6): qty 1

## 2014-08-31 MED ORDER — DIPHENHYDRAMINE HCL 25 MG PO CAPS: 25.0000 mg | ORAL_CAPSULE | Freq: Four times a day (QID) | ORAL | Status: DC | PRN

## 2014-08-31 MED ORDER — OXYTOCIN 40 UNITS IN LACTATED RINGERS INFUSION - SIMPLE MED
1.0000 m[IU]/min | INTRAVENOUS | Status: DC
  Administered 2014-08-31: 2 m[IU]/min via INTRAVENOUS
  Filled 2014-08-31: qty 1000

## 2014-08-31 MED ORDER — ZOLPIDEM TARTRATE 5 MG PO TABS: 5.0000 mg | ORAL_TABLET | Freq: Every evening | ORAL | Status: DC | PRN

## 2014-08-31 MED ORDER — FENTANYL 2.5 MCG/ML BUPIVACAINE 1/10 % EPIDURAL INFUSION (WH - ANES)
14.0000 mL/h | INTRAMUSCULAR | Status: DC | PRN
  Administered 2014-08-31: 14 mL/h via EPIDURAL

## 2014-08-31 MED ORDER — LIDOCAINE HCL (PF) 1 % IJ SOLN
INTRAMUSCULAR | Status: DC | PRN
  Administered 2014-08-31 (×2): 5 mL

## 2014-08-31 MED ORDER — LACTATED RINGERS IV SOLN: INTRAVENOUS | Status: DC

## 2014-08-31 MED ORDER — SIMETHICONE 80 MG PO CHEW: 80.0000 mg | CHEWABLE_TABLET | ORAL | Status: DC | PRN

## 2014-08-31 MED ORDER — LIDOCAINE HCL (PF) 1 % IJ SOLN
30.0000 mL | INTRAMUSCULAR | Status: DC | PRN
  Filled 2014-08-31: qty 30

## 2014-08-31 MED ORDER — SENNOSIDES-DOCUSATE SODIUM 8.6-50 MG PO TABS
2.0000 | ORAL_TABLET | ORAL | Status: DC
  Administered 2014-09-01 (×2): 2 via ORAL
  Filled 2014-08-31 (×2): qty 2

## 2014-08-31 MED ORDER — OXYCODONE-ACETAMINOPHEN 5-325 MG PO TABS: 1.0000 | ORAL_TABLET | ORAL | Status: DC | PRN

## 2014-08-31 MED ORDER — LACTATED RINGERS IV SOLN
INTRAVENOUS | Status: DC
  Administered 2014-08-31 (×3): via INTRAVENOUS

## 2014-08-31 MED ORDER — TERBUTALINE SULFATE 1 MG/ML IJ SOLN: 0.2500 mg | Freq: Once | INTRAMUSCULAR | Status: DC | PRN

## 2014-08-31 MED ORDER — ACETAMINOPHEN 325 MG PO TABS: 650.0000 mg | ORAL_TABLET | ORAL | Status: DC | PRN

## 2014-08-31 MED ORDER — PRENATAL MULTIVITAMIN CH
1.0000 | ORAL_TABLET | Freq: Every day | ORAL | Status: DC
Start: 2014-09-01 — End: 2014-09-02
  Administered 2014-09-01: 1 via ORAL
  Filled 2014-08-31: qty 1

## 2014-08-31 MED ORDER — ONDANSETRON HCL 4 MG/2ML IJ SOLN
4.0000 mg | INTRAMUSCULAR | Status: DC | PRN
Start: 2014-08-31 — End: 2014-09-02

## 2014-08-31 MED ORDER — EPHEDRINE 5 MG/ML INJ
10.0000 mg | INTRAVENOUS | Status: DC | PRN
  Filled 2014-08-31: qty 2

## 2014-08-31 MED ORDER — CITRIC ACID-SODIUM CITRATE 334-500 MG/5ML PO SOLN
30.0000 mL | ORAL | Status: DC | PRN
  Administered 2014-08-31: 30 mL via ORAL
  Filled 2014-08-31: qty 15

## 2014-08-31 MED ORDER — OXYTOCIN 40 UNITS IN LACTATED RINGERS INFUSION - SIMPLE MED: 62.5000 mL/h | INTRAVENOUS | Status: DC

## 2014-08-31 MED ORDER — ONDANSETRON HCL 4 MG/2ML IJ SOLN: 4.0000 mg | Freq: Four times a day (QID) | INTRAMUSCULAR | Status: DC | PRN

## 2014-08-31 MED ORDER — OXYCODONE-ACETAMINOPHEN 5-325 MG PO TABS
1.0000 | ORAL_TABLET | ORAL | Status: DC | PRN
Start: 2014-08-31 — End: 2014-09-02

## 2014-08-31 MED ORDER — CALCIUM CARBONATE ANTACID 500 MG PO CHEW: 2.0000 | CHEWABLE_TABLET | Freq: Every day | ORAL | Status: DC | PRN

## 2014-08-31 MED ORDER — OXYTOCIN BOLUS FROM INFUSION: 500.0000 mL | INTRAVENOUS | Status: DC

## 2014-08-31 MED ORDER — DIPHENHYDRAMINE HCL 50 MG/ML IJ SOLN: 12.5000 mg | INTRAMUSCULAR | Status: DC | PRN

## 2014-08-31 MED ORDER — LACTATED RINGERS IV SOLN
500.0000 mL | Freq: Once | INTRAVENOUS | Status: AC
  Administered 2014-08-31: 500 mL via INTRAVENOUS

## 2014-08-31 MED ORDER — DIBUCAINE 1 % RE OINT
1.0000 "application " | TOPICAL_OINTMENT | RECTAL | Status: DC | PRN
  Filled 2014-08-31: qty 28

## 2014-08-31 MED ORDER — PHENYLEPHRINE 40 MCG/ML (10ML) SYRINGE FOR IV PUSH (FOR BLOOD PRESSURE SUPPORT)
PREFILLED_SYRINGE | INTRAVENOUS | Status: AC
  Filled 2014-08-31: qty 10

## 2014-08-31 MED ORDER — LACTATED RINGERS IV SOLN
500.0000 mL | INTRAVENOUS | Status: DC | PRN
  Administered 2014-08-31: 500 mL via INTRAVENOUS

## 2014-08-31 MED ORDER — BENZOCAINE-MENTHOL 20-0.5 % EX AERO
1.0000 "application " | INHALATION_SPRAY | CUTANEOUS | Status: DC | PRN
  Filled 2014-08-31: qty 56

## 2014-08-31 NOTE — Anesthesia Preprocedure Evaluation (Signed)

## 2014-08-31 NOTE — Anesthesia Procedure Notes (Signed)
Epidural Patient location during procedure: OB Start time: 08/31/2014 9:26 AM  Staffing Anesthesiologist: Brayton CavesJACKSON, Sharis Keeran Performed by: anesthesiologist   Preanesthetic Checklist Completed: patient identified, site marked, surgical consent, pre-op evaluation, timeout performed, IV checked, risks and benefits discussed and monitors and equipment checked  Epidural Patient position: sitting Prep: site prepped and draped and DuraPrep Patient monitoring: continuous pulse ox and blood pressure Approach: midline Location: L3-L4 Injection technique: LOR air  Needle:  Needle type: Tuohy  Needle gauge: 17 G Needle length: 9 cm and 9 Needle insertion depth: 8 cm Catheter type: closed end flexible Catheter size: 19 Gauge Catheter at skin depth: 13 cm Test dose: negative  Assessment Events: blood not aspirated, injection not painful, no injection resistance, negative IV test and no paresthesia  Additional Notes Patient identified.  Risk benefits discussed including failed block, incomplete pain control, headache, nerve damage, paralysis, blood pressure changes, nausea, vomiting, reactions to medication both toxic or allergic, and postpartum back pain.  Patient expressed understanding and wished to proceed.  All questions were answered.  Sterile technique used throughout procedure and epidural site dressed with sterile barrier dressing. No paresthesia or other complications noted.The patient did not experience any signs of intravascular injection such as tinnitus or metallic taste in mouth nor signs of intrathecal spread such as rapid motor block. Please see nursing notes for vital signs.

## 2014-08-31 NOTE — Progress Notes (Signed)
Patient ID: Sonya Phillips, female   DOB: 05/07/1981, 33 y.o.   MRN: [id number]  Comfortable with epidural  AFVSS gen NAD FHTs 120's good var, category 2 toco irregular  SVE 10/100/+2  Plan for SVD, will push.

## 2014-08-31 NOTE — Progress Notes (Signed)
MD called for update. Notified of repetitive variables that improved with position change.  

## 2014-08-31 NOTE — Progress Notes (Signed)
Patient ID: Sonya Phillips, female   DOB: 10/14/1981, 33 y.o.   MRN: [id number]  Comfortable with epidural  AFVSS gen NAD  FHTs 120's, good var toco Q 2min  SVE 5/70/-1  Continue current mgmt

## 2014-08-31 NOTE — Progress Notes (Signed)
Patient ID: Sonya SparrowBrandy S Cooley, female   DOB: 04/09/1981, 33 y.o.   MRN: [id number]  AROM for clear fluid, copious, w/o diff/comp FHT 130's, category 1 toco Q 4min On pitocin  Continue current mgmt

## 2014-09-01 LAB — CBC
HCT: 33 % — ABNORMAL LOW (ref 36.0–46.0)
HEMOGLOBIN: 10.7 g/dL — AB (ref 12.0–15.0)
MCH: 27.6 pg (ref 26.0–34.0)
MCHC: 32.4 g/dL (ref 30.0–36.0)
MCV: 85.1 fL (ref 78.0–100.0)
Platelets: 142 10*3/uL — ABNORMAL LOW (ref 150–400)
RBC: 3.88 MIL/uL (ref 3.87–5.11)
RDW: 14.5 % (ref 11.5–15.5)
WBC: 10.7 10*3/uL — ABNORMAL HIGH (ref 4.0–10.5)

## 2014-09-01 NOTE — Lactation Note (Signed)
This note was copied from the chart of Sonya Phillips. Lactation Consultation Note Experience BF mom who BF her other children for 6 months each. Her youngest is 3 yrs. Old. Denied and challenges. States this BF is going well w/o concerns. Denies soreness. Mom encouraged to feed baby 8-12 times/24 hours and with feeding cues. Mom encouraged to waken baby for feeds. Mom encouraged to do skin-to-skin.Reviewed Baby & Me book's Breastfeeding Basics. [facility] brochure given w/resources, support groups and LC services. Encouraged to call for assistance if needed and to verify proper latch. Patient Name: Sonya Phillips Today's Date: 09/01/2014 Reason for consult: Initial assessment   Maternal Data Has patient been taught Hand Expression?: Yes Does the patient have breastfeeding experience prior to this delivery?: Yes  Feeding    LATCH Score/Interventions                      Lactation Tools Discussed/Used     Consult Status Consult Status: Follow-up Date: 09/01/14 Follow-up type: In-patient    Lilibeth Opie, Diamond NickelLAURA G 09/01/2014, 3:51 AM

## 2014-09-01 NOTE — Progress Notes (Signed)
Post Partum Day 1 Subjective: no complaints, up ad lib, tolerating PO and nl lochia, pain controlled  Objective: Blood pressure 113/64, pulse 89, temperature 98.4 F (36.9 C), temperature source Oral, resp. rate 20, height 5\' 8"  (1.727 m), weight 109.317 kg (241 lb), last menstrual period 07/19/2013, SpO2 99.00%, unknown if currently breastfeeding.  Physical Exam:  General: alert and no distress Lochia: appropriate Uterine Fundus: firm   Recent Labs  08/31/14 0800 09/01/14 0648  HGB 11.6* 10.7*  HCT 36.0 33.0*    Assessment/Plan: Breastfeeding and Lactation consult.  Routine care.     LOS: 1 day   [name], Maniyah Moller 09/01/2014, 8:02 AM

## 2014-09-01 NOTE — Progress Notes (Signed)
UR chart review completed.  

## 2014-09-02 MED ORDER — IBUPROFEN 800 MG PO TABS: 800.0000 mg | ORAL_TABLET | Freq: Three times a day (TID) | ORAL | Status: DC | PRN

## 2014-09-02 MED ORDER — OXYCODONE-ACETAMINOPHEN 5-325 MG PO TABS: 1.0000 | ORAL_TABLET | Freq: Four times a day (QID) | ORAL | Status: DC | PRN

## 2014-09-02 MED ORDER — PRENATAL MULTIVITAMIN CH: 1.0000 | ORAL_TABLET | Freq: Every day | ORAL | Status: DC

## 2014-09-02 NOTE — Lactation Note (Signed)
This note was copied from the chart of Sonya Phillips. Lactation Consultation Note  Patient Name: Sonya Phillips Today's Date: 09/02/2014 Reason for consult: Follow-up assessment (baby recenly fed per mom ) Per mom breast feeding is going well , denies soreness.  LC reviewed sore nipple and engorgement prevention and tx  Mother informed of post-discharge support and given phone number to the lactation department, including services for  phone call assistance; out-patient appointments; and breastfeeding support group. List of other breastfeeding resources  in the community given in the handout. Encouraged mother to call for problems or concerns related to breastfeeding.   Maternal Data    Feeding Length of feed: 30 min  LATCH Score/Interventions                      Lactation Tools Discussed/Used     Consult Status Consult Status: Complete Date: 09/02/14    Kathrin Greathouseorio, Dominque Levandowski Ann 09/02/2014, 10:16 AM

## 2014-09-02 NOTE — Discharge Summary (Signed)
Obstetric Discharge Summary Reason for Admission: induction of labor Prenatal Procedures: none Intrapartum Procedures: spontaneous vaginal delivery Postpartum Procedures: none Complications-Operative and Postpartum: vaginal laceration, hemostatic Hemoglobin  Date Value Ref Range Status  09/01/2014 10.7* 12.0 - 15.0 g/dL Final     HCT  Date Value Ref Range Status  09/01/2014 33.0* 36.0 - 46.0 % Final    Physical Exam:  General: alert and no distress Lochia: appropriate Uterine Fundus: firm  Discharge Diagnoses: Term Pregnancy-delivered  Discharge Information: Date: 09/02/2014 Activity: pelvic rest Diet: routine Medications: PNV, Ibuprofen and Percocet Condition: stable Instructions: refer to practice specific booklet Discharge to: home Follow-up Information   Follow up with [name], Cherika Jessie, MD. Schedule an appointment as soon as possible for a visit in 6 weeks. (for postpartum check)    Specialty:  Obstetrics and Gynecology   Contact information:   [address] [place] [address] [phone]       Newborn Data: Live born female  Birth Weight: 6 lb 14.8 oz (3141 g) APGAR: 9, 9  Home with mother.  [name], Selassie Spatafore 09/02/2014, 8:00 AM

## 2014-09-02 NOTE — Progress Notes (Signed)
Post Partum Day 2 Subjective: no complaints, up ad lib, tolerating PO and nl lochia, pain controlled  Objective: Blood pressure 109/63, pulse 84, temperature 98.4 F (36.9 C), temperature source Oral, resp. rate 18, height 5\' 8"  (1.727 m), weight 109.317 kg (241 lb), last menstrual period 07/19/2013, SpO2 99.00%, unknown if currently breastfeeding.  Physical Exam:  General: alert and no distress Lochia: appropriate Uterine Fundus: firm   Recent Labs  08/31/14 0800 09/01/14 0648  HGB 11.6* 10.7*  HCT 36.0 33.0*    Assessment/Plan: Discharge home, Breastfeeding and Lactation consult.  D/c with Motrin, percocet and PNV.  F/u 6 weeks   LOS: 2 days   [name], Ragan Reale 09/02/2014, 7:50 AM

## 2014-09-03 NOTE — Anesthesia Postprocedure Evaluation (Signed)
  Anesthesia Post Note  Patient: Sonya Phillips  Procedure(s) Performed: * No procedures listed *  Anesthesia type: Epidural  Patient location: Mother/Baby  Post pain: Pain level controlled  Post assessment: Post-op Vital signs reviewed  Last Vitals: There were no vitals filed for this visit.  Post vital signs: Reviewed  Level of consciousness: awake  Complications: No apparent anesthesia complications * per chart review

## 2014-09-15 ENCOUNTER — Encounter (HOSPITAL_COMMUNITY): Payer: Self-pay

## 2014-09-26 ENCOUNTER — Encounter (HOSPITAL_COMMUNITY): Payer: Self-pay

## 2015-10-17 ENCOUNTER — Encounter (HOSPITAL_COMMUNITY): Payer: Self-pay | Admitting: Emergency Medicine

## 2015-10-17 ENCOUNTER — Emergency Department (HOSPITAL_COMMUNITY)
Admission: EM | Admit: 2015-10-17 | Discharge: 2015-10-17 | Disposition: A | Discharge status: Home / Self Care | Payer: 59 | Attending: Emergency Medicine | Admitting: Emergency Medicine

## 2015-10-17 ENCOUNTER — Emergency Department (HOSPITAL_COMMUNITY): Payer: 59

## 2015-10-17 DIAGNOSIS — Z87891 Personal history of nicotine dependence: Secondary | ICD-10-CM | POA: Diagnosis not present

## 2015-10-17 DIAGNOSIS — Z872 Personal history of diseases of the skin and subcutaneous tissue: Secondary | ICD-10-CM | POA: Diagnosis not present

## 2015-10-17 DIAGNOSIS — Z87440 Personal history of urinary (tract) infections: Secondary | ICD-10-CM | POA: Insufficient documentation

## 2015-10-17 DIAGNOSIS — Z8619 Personal history of other infectious and parasitic diseases: Secondary | ICD-10-CM | POA: Diagnosis not present

## 2015-10-17 DIAGNOSIS — M62830 Muscle spasm of back: Secondary | ICD-10-CM | POA: Insufficient documentation

## 2015-10-17 DIAGNOSIS — Z79899 Other long term (current) drug therapy: Secondary | ICD-10-CM | POA: Diagnosis not present

## 2015-10-17 DIAGNOSIS — M79601 Pain in right arm: Secondary | ICD-10-CM | POA: Diagnosis present

## 2015-10-17 DIAGNOSIS — M62838 Other muscle spasm: Secondary | ICD-10-CM

## 2015-10-17 MED ORDER — CYCLOBENZAPRINE HCL 10 MG PO TABS: 5.0000 mg | ORAL_TABLET | Freq: Two times a day (BID) | ORAL | Status: DC | PRN

## 2015-10-17 NOTE — ED Provider Notes (Signed)
CSN: [id number]     Arrival date & time 10/17/15  0804 History   First MD Initiated Contact with Patient 10/17/15 0830     Chief Complaint  Patient presents with  . Arm Pain     (Consider location/radiation/quality/duration/timing/severity/associated sxs/prior Treatment) HPI  The patient presents to the emergency department with complaints of bilateral arm numbness, deep aching pain that has been persisting for one week. She had a baby one year ago and recently stopped breast-feeding. The patient has the pain throughout the night, wakes up with the pain in a gradually resolves throughout the day essentially gone before bed. But then when she lays down happens all over again. She denies taking any blood pressure medicines or having blood pressure problems, in triage her blood pressure is 129/80. She denies waking up to her hands being cold, having change of color, or severe weakness. She's never had this problem before. Denies any back or neck pain, denies any recent fall or injury.  Past Medical History  Diagnosis Date  . Eczema   . Normal pregnancy in multigravida in third trimester 08/30/2014  . Infection     UTI  . Herpes   . SVD (spontaneous vaginal delivery) 08/31/2014   Past Surgical History  Procedure Laterality Date  . Therapeutic abortion    . Dilation and evacuation N/A 10/01/2013    Procedure: DILATATION AND EVACUATION;  Surgeon: Bing Plumehomas F Henley, MD;  Location: [facility];  Service: Gynecology;  Laterality: N/A;   Family History  Problem Relation Age of Onset  . Diabetes Father   . Cancer Paternal Uncle   . Diabetes Paternal Grandmother   . Hearing loss Neg Hx    Social History  Substance Use Topics  . Smoking status: Former Games developermoker  . Smokeless tobacco: Never Used     Comment: with preg  . Alcohol Use: No   OB History    Gravida Para Term Preterm AB TAB SAB Ectopic Multiple Living   7 4 4  0 3 1 2  0 0 4     Review of Systems  ROS: See HPI Constitutional: no  fever  Eyes: no drainage  ENT: no runny nose  Cardiovascular: no chest pain  Resp: no SOB  GI: no vomiting GU: no dysuria Integumentary: no rash  Allergy: no hives  Musculoskeletal: no leg swelling  Neurological: no slurred speech ROS otherwise negative   Allergies  Review of patient's allergies indicates no known allergies.  Home Medications   Prior to Admission medications   Medication Sig Start Date End Date Taking? Authorizing Provider  calcium carbonate (TUMS - DOSED IN MG ELEMENTAL CALCIUM) 500 MG chewable tablet Chew 2 tablets by mouth daily as needed for indigestion or heartburn.    Historical Provider, MD  cyclobenzaprine (FLEXERIL) 10 MG tablet Take 0.5-1 tablets (5-10 mg total) by mouth 2 (two) times daily as needed. 10/17/15   Klark Vanderhoef Neva SeatGreene, PA-C  ibuprofen (ADVIL,MOTRIN) 800 MG tablet Take 1 tablet (800 mg total) by mouth every 8 (eight) hours as needed. 09/02/14   Sherian ReinJody [name], MD  oxyCODONE-acetaminophen (PERCOCET/ROXICET) 5-325 MG per tablet Take 1-2 tablets by mouth every 6 (six) hours as needed for moderate pain. 09/02/14   Sherian ReinJody [name], MD  Prenatal Vit-Fe Fumarate-FA (PRENATAL MULTIVITAMIN) TABS tablet Take 1 tablet by mouth daily at 12 noon. 09/02/14   Jody [name], MD  Psyllium (METAMUCIL PO) Take 1 capsule by mouth daily as needed (for constipation).    Historical Provider, MD   BP 114/73 mmHg  Pulse 77  Temp(Src) 98 F (36.7 C) (Oral)  Resp 18  Ht  (1.702 m)  Wt 98.294 kg  BMI 33.93 kg/m2  SpO2 100%  LMP 10/03/2015 (Approximate) Physical Exam  Constitutional: She appears well-developed and well-nourished. No distress.  HENT:  Head: Normocephalic and atraumatic.  Eyes: Pupils are equal, round, and reactive to light.  Neck: Normal range of motion. Neck supple. No spinous process tenderness and no muscular tenderness present. No rigidity. No edema, no erythema and normal range of motion present.  Cardiovascular:  Normal rate and regular rhythm.   Pulmonary/Chest: Effort normal.  Abdominal: Soft.  Musculoskeletal:  Physiologic upper grip strength, normal strength to bilateral upper extremities. Radial pulses are symmetrical and physiologic. Skin is warm and moist with capillary refill brisk in all 10 digits. No rash visualized. Normal sensation to touch. No abnormalities on physical exam.  Neurological: She is alert.  Skin: Skin is warm and dry.  Nursing note and vitals reviewed.   ED Course  Procedures (including critical care time) Labs Review Labs Reviewed - No data to display  Imaging Review Ct Cervical Spine Wo Contrast  10/17/2015  CLINICAL DATA:  One week history of bilateral upper extremity numbness. No history of recent trauma EXAM: CT CERVICAL SPINE WITHOUT CONTRAST TECHNIQUE: Multidetector CT imaging of the cervical spine was performed without intravenous contrast. Multiplanar CT image reconstructions were also generated. COMPARISON:  None. FINDINGS: There is no fracture or spondylolisthesis. Prevertebral soft tissues and predental space regions are normal. There is no appreciable disc space narrowing. At C2-3, there is no nerve root edema or effacement. No disc extrusion or stenosis. At C3-4, there is no nerve root edema or effacement. No disc extrusion or stenosis. At C4-5, there is no nerve root edema or effacement. No disc extrusion or stenosis. At C5-6, there is no nerve root edema or effacement. No disc extrusion or stenosis. There is minimal facet hypertrophy on the left at this level. At C6-7, there is minimal facet hypertrophy bilaterally. No nerve root edema or effacement. No disc extrusion or stenosis. At C7-T1, there is no nerve root edema or effacement. No disc extrusion or stenosis. There are no appreciable paraspinous lesions. IMPRESSION: No nerve root edema or effacement. No disc extrusion or stenosis. No fracture or spondylolisthesis. Electronically Signed   By: Bretta Bang  III M.D.   On: 10/17/2015 10:04   I have personally reviewed and evaluated these images and lab results as part of my medical decision-making.   EKG Interpretation None      MDM   Final diagnoses:  Cervical paraspinal muscle spasm   34 y.o.Sonya Phillips's  with neck pain.   No neurological deficits and normal neuro exam. No fever, night sweats, weight loss, h/o cancer, IVDU. The patient can walk with some discomfort.   Patient Plan 1. Medications: NSAIDs and/or muscle relaxer. Cont usual home medications unless otherwise directed.--referral to ortho 2. Treatment: rest, drink plenty of fluids, gentle stretching as discussed, alternate ice and heat  3. Follow Up: Please followup with your primary doctor for discussion of your diagnoses and further evaluation after today's visit; if you do not have a primary care doctor use the resource guide provided to find one  Advised to follow-up with the orthopedist if symptoms do not start to resolve in the next 2-3 days. If develop loss of bowel or urinary control return to the ED as soon as possible for further evaluation. To take the medications as prescribed as  they can cause harm if not taken appropriately.   Vital signs are stable at discharge. Filed Vitals:   10/17/15 0814 10/17/15 0921  BP: 129/80 114/73  Pulse: 82 77  Temp: 98 F (36.7 C)   Resp: 18 18    Patient/guardian has voiced understanding and agreed to follow-up with the PCP or specialist.   I personally performed the services described in this documentation, which was scribed in my presence. The recorded information has been reviewed and is accurate.       Marlon Pel, PA-C 10/17/15 1015  Melene Plan, DO 10/17/15 [phone]

## 2015-10-17 NOTE — ED Notes (Signed)
Pt from home with c/o bilateral arm discomfort with numbness and tingling during the night this past week.  Pt reports soreness during the day now and has tried sleeping in multiple positions with no relief.  Pt denies pain or increased tingling with palpation to shoulders and neck.  Full ROM. NAD, A&O.

## 2015-10-17 NOTE — Discharge Instructions (Signed)
Paresthesia Paresthesia is an abnormal burning or prickling sensation. This sensation is generally felt in the hands, arms, legs, or feet. However, it may occur in any part of the body. Usually, it is not painful. The feeling may be described as:  Tingling or numbness.  Pins and needles.  Skin crawling.  Buzzing.  Limbs falling asleep.  Itching. Most people experience temporary (transient) paresthesia at some time in their lives. Paresthesia may occur when you breathe too quickly (hyperventilation). It can also occur without any apparent cause. Commonly, paresthesia occurs when pressure is placed on a nerve. The sensation quickly goes away after the pressure is removed. For some people, however, paresthesia is a long-lasting (chronic) condition that is caused by an underlying disorder. If you continue to have paresthesia, you may need further medical evaluation. HOME CARE INSTRUCTIONS Watch your condition for any changes. Taking the following actions may help to lessen any discomfort that you are feeling:  Avoid drinking alcohol.  Try acupuncture or massage to help relieve your symptoms.  Keep all follow-up visits as directed by your health care provider. This is important. SEEK MEDICAL CARE IF:  You continue to have episodes of paresthesia.  Your burning or prickling feeling gets worse when you walk.  You have pain, cramps, or dizziness.  You develop a rash. SEEK IMMEDIATE MEDICAL CARE IF:  You feel weak.  You have trouble walking or moving.  You have problems with speech, understanding, or vision.  You feel confused.  You cannot control your bladder or bowel movements.  You have numbness after an injury.  You faint.   This information is not intended to replace advice given to you by your health care provider. Make sure you discuss any questions you have with your health care provider.   Document Released: 11/01/2002 Document Revised: 03/28/2015 Document Reviewed:  11/07/2014 Elsevier Interactive Patient Education 2016 Elsevier Inc.  

## 2015-11-26 NOTE — L&D Delivery Note (Signed)
Delivery Note At 12:16 PM a viable and healthy female was delivered via Vaginal, Spontaneous Delivery (Presentation: OA; LOT ).  APGAR: 8,9 ; weight P .   Placenta status: delivered, intact .  Cord: 3VC with the following complications: none.    Anesthesia:  Epidural  Episiotomy: None Lacerations: None Suture Repair: none Est. Blood Loss (mL): 150cc  Mom to postpartum.  Baby to Couplet care / Skin to Skin.  [name], Carlesha Seiple 10/21/2016, 12:44 PM  [facility]AB[facility]/ Contra BTl  D/w pt circumcision for female infant inc r/b/a and procedure, to proceed in office  D/W pt r/b/a and process of PP BTL, wishes to proceed

## 2016-03-29 LAB — OB RESULTS CONSOLE GC/CHLAMYDIA
Chlamydia: NEGATIVE
Gonorrhea: NEGATIVE

## 2016-03-29 LAB — OB RESULTS CONSOLE RPR: RPR: NONREACTIVE

## 2016-03-29 LAB — OB RESULTS CONSOLE ABO/RH: RH Type: POSITIVE

## 2016-03-29 LAB — OB RESULTS CONSOLE RUBELLA ANTIBODY, IGM: Rubella: IMMUNE

## 2016-03-29 LAB — OB RESULTS CONSOLE HEPATITIS B SURFACE ANTIGEN: Hepatitis B Surface Ag: NEGATIVE

## 2016-03-29 LAB — OB RESULTS CONSOLE HIV ANTIBODY (ROUTINE TESTING): HIV: NONREACTIVE

## 2016-03-29 LAB — OB RESULTS CONSOLE ANTIBODY SCREEN: Antibody Screen: NEGATIVE

## 2016-09-25 LAB — OB RESULTS CONSOLE GBS: GBS: NEGATIVE

## 2016-10-15 ENCOUNTER — Telehealth (HOSPITAL_COMMUNITY): Payer: Self-pay | Admitting: *Deleted

## 2016-10-15 ENCOUNTER — Encounter (HOSPITAL_COMMUNITY): Payer: Self-pay | Admitting: *Deleted

## 2016-10-15 NOTE — Telephone Encounter (Signed)
Preadmission screen  

## 2016-10-20 MED ORDER — CALCIUM CARBONATE ANTACID 500 MG PO CHEW: 2.0000 | CHEWABLE_TABLET | Freq: Every day | ORAL | Status: DC | PRN

## 2016-10-20 NOTE — H&P (Signed)
Sonya GathersBrandy S Phillips is a 35 y.o. female [phone] at 39+ for IOL given term and favorable.  Pt with h/o HSV 2 - culture proven, only 1 outbreak, received suppression.  Pregnancy also complicated by AMA - with normal panorama - low risk.  Pt declined Tdap and flu.  Otherwise relatively uncomplicated prenatal care.    OB History    Gravida Para Term Preterm AB Living   8 4 4  0 3 4   SAB TAB Ectopic Multiple Live Births   2 1 0 0 1    SAB x2, TAB x 1 (D&C x 2) TSVD x 4, fx3, Mx 1; 6#9-7#8  Desires PP BTL No abn pap, last 7/13 - HR HPV neg HSV 2, outbreak x 1   Past Medical History:  Diagnosis Date  . Eczema   . Herpes   . Infection    UTI  . Normal pregnancy in multigravida in third trimester 08/30/2014  . SVD (spontaneous vaginal delivery) 08/31/2014   Past Surgical History:  Procedure Laterality Date  . DILATION AND EVACUATION N/A 10/01/2013   Procedure: DILATATION AND EVACUATION;  Surgeon: Bing Plumehomas F Henley, MD;  Location: [facility];  Service: Gynecology;  Laterality: N/A;  . THERAPEUTIC ABORTION     Family History: family history includes Cancer in her paternal uncle; Diabetes in her father and paternal grandmother. Social History:  reports that she has quit smoking. She has never used smokeless tobacco. She reports that she does not drink alcohol or use drugs. married, works for [employer]  Meds PNV, Zantac, Valtrex All NKDA     Maternal Diabetes: No Genetic Screening: Normal Maternal Ultrasounds/Referrals: Normal Fetal Ultrasounds or other Referrals:  None Maternal Substance Abuse:  No Significant Maternal Medications:  None Significant Maternal Lab Results:  Lab values include: Group B Strep negative Other Comments:  AMA - low risk panorama, HSV on supression  Review of Systems  Constitutional: Negative.   HENT: Negative.   Eyes: Negative.   Respiratory: Negative.   Cardiovascular: Negative.   Gastrointestinal: Negative.   Genitourinary: Negative.   Musculoskeletal:  Positive for back pain.  Skin: Negative.   Neurological: Negative.   Psychiatric/Behavioral: Negative.    Maternal Medical History:  Contractions: Frequency: irregular.    Fetal activity: Perceived fetal activity is normal.    Prenatal Complications - Diabetes: none.      Last menstrual period 01/20/2016, unknown if currently breastfeeding. Maternal Exam:  Uterine Assessment: Contraction strength is moderate.  Contraction frequency is irregular.   Abdomen: Fundal height is appropriate for gestation.   Estimated fetal weight is 7-8#.   Fetal presentation: vertex  Introitus: Normal vulva. Normal vagina.  Cervix: Cervix evaluated by digital exam.     Physical Exam  Constitutional: She is oriented to person, place, and time. She appears well-developed and well-nourished.  HENT:  Head: Normocephalic and atraumatic.  Cardiovascular: Normal rate and regular rhythm.   Respiratory: Effort normal and breath sounds normal. No respiratory distress. She has no wheezes.  GI: Soft. Bowel sounds are normal. She exhibits no distension. There is no tenderness.  Musculoskeletal: Normal range of motion.  Neurological: She is alert and oriented to person, place, and time.  Skin: Skin is warm and dry.  Psychiatric: She has a normal mood and affect. Her behavior is normal.    Prenatal labs: ABO, Rh: AB/Positive/-- (05/05 0000) Antibody: Negative (05/05 0000) Rubella: Immune (05/05 0000) RPR: Nonreactive (05/05 0000)  HBsAg: Negative (05/05 0000)  HIV: Non-reactive (05/05 0000)  GBS:  Negative (11/01 0000)   Dated by LMP cw First trimester US  Desires BTL, signed tubal papers, d/w pt r/b/a = wishes to proceed  Declined Tdap, Flu  Hgb 12.9/Plt 223/Ur Cx neg/GC neg/Chl neg/Hgb electro WNL/ Nl NT, nl AFP - Panorama low risk, female glucola 124/essential panel neg  US nl anat (limited then nl on f/u), ant plac, female Assessment/Plan: 35yo [id number] at 39+ for IOL given term and favorable,  also AMA and herpres  AROM and pitocin to induce Epidural prn PP BTL per pt request. Expect SVD   [name], Donavin Audino 10/20/2016, 9:49 PM

## 2016-10-21 ENCOUNTER — Inpatient Hospital Stay (HOSPITAL_COMMUNITY)
Admission: RE | Admit: 2016-10-21 | Discharge: 2016-10-23 | DRG: 767 | Disposition: A | Discharge status: Home / Self Care | Payer: 59 | Source: Ambulatory Visit | Attending: Obstetrics and Gynecology | Admitting: Obstetrics and Gynecology

## 2016-10-21 ENCOUNTER — Encounter (HOSPITAL_COMMUNITY): Payer: Self-pay

## 2016-10-21 ENCOUNTER — Inpatient Hospital Stay (HOSPITAL_COMMUNITY): Payer: 59 | Admitting: Anesthesiology

## 2016-10-21 ENCOUNTER — Inpatient Hospital Stay (HOSPITAL_COMMUNITY): Payer: 59 | Admitting: Certified Registered Nurse Anesthetist

## 2016-10-21 ENCOUNTER — Encounter (HOSPITAL_COMMUNITY)
Admission: RE | Disposition: A | Discharge status: Home / Self Care | Payer: Self-pay | Source: Ambulatory Visit | Attending: Obstetrics and Gynecology

## 2016-10-21 DIAGNOSIS — O99214 Obesity complicating childbirth: Secondary | ICD-10-CM | POA: Diagnosis present

## 2016-10-21 DIAGNOSIS — A6000 Herpesviral infection of urogenital system, unspecified: Secondary | ICD-10-CM | POA: Diagnosis present

## 2016-10-21 DIAGNOSIS — Z87891 Personal history of nicotine dependence: Secondary | ICD-10-CM | POA: Diagnosis not present

## 2016-10-21 DIAGNOSIS — O9832 Other infections with a predominantly sexual mode of transmission complicating childbirth: Principal | ICD-10-CM | POA: Diagnosis present

## 2016-10-21 DIAGNOSIS — Z3A39 39 weeks gestation of pregnancy: Secondary | ICD-10-CM | POA: Diagnosis not present

## 2016-10-21 DIAGNOSIS — Z833 Family history of diabetes mellitus: Secondary | ICD-10-CM | POA: Diagnosis not present

## 2016-10-21 DIAGNOSIS — O09523 Supervision of elderly multigravida, third trimester: Secondary | ICD-10-CM | POA: Diagnosis present

## 2016-10-21 DIAGNOSIS — Z3493 Encounter for supervision of normal pregnancy, unspecified, third trimester: Secondary | ICD-10-CM | POA: Diagnosis present

## 2016-10-21 DIAGNOSIS — Z6837 Body mass index (BMI) 37.0-37.9, adult: Secondary | ICD-10-CM

## 2016-10-21 DIAGNOSIS — Z302 Encounter for sterilization: Secondary | ICD-10-CM

## 2016-10-21 DIAGNOSIS — Z9851 Tubal ligation status: Secondary | ICD-10-CM

## 2016-10-21 HISTORY — PX: TUBAL LIGATION: SHX77

## 2016-10-21 HISTORY — DX: Tubal ligation status: Z98.51

## 2016-10-21 LAB — CBC
HCT: 34.3 % — ABNORMAL LOW (ref 36.0–46.0)
HEMOGLOBIN: 11.2 g/dL — AB (ref 12.0–15.0)
MCH: 27.2 pg (ref 26.0–34.0)
MCHC: 32.7 g/dL (ref 30.0–36.0)
MCV: 83.3 fL (ref 78.0–100.0)
PLATELETS: 155 10*3/uL (ref 150–400)
RBC: 4.12 MIL/uL (ref 3.87–5.11)
RDW: 15.3 % (ref 11.5–15.5)
WBC: 7.4 10*3/uL (ref 4.0–10.5)

## 2016-10-21 LAB — TYPE AND SCREEN
ABO/RH(D): AB POS
Antibody Screen: NEGATIVE

## 2016-10-21 LAB — RPR: RPR: NONREACTIVE

## 2016-10-21 SURGERY — LIGATION, FALLOPIAN TUBE, POSTPARTUM
Anesthesia: Epidural | Site: Abdomen | Laterality: Bilateral | Wound class: Clean Contaminated

## 2016-10-21 MED ORDER — LACTATED RINGERS IV SOLN: 500.0000 mL | Freq: Once | INTRAVENOUS | Status: DC

## 2016-10-21 MED ORDER — PHENYLEPHRINE 40 MCG/ML (10ML) SYRINGE FOR IV PUSH (FOR BLOOD PRESSURE SUPPORT)
80.0000 ug | PREFILLED_SYRINGE | INTRAVENOUS | Status: DC | PRN
  Filled 2016-10-21: qty 5

## 2016-10-21 MED ORDER — LACTATED RINGERS IV SOLN: INTRAVENOUS | Status: DC

## 2016-10-21 MED ORDER — SODIUM BICARBONATE 8.4 % IV SOLN
INTRAVENOUS | Status: AC
  Filled 2016-10-21: qty 50

## 2016-10-21 MED ORDER — BUPIVACAINE HCL (PF) 0.25 % IJ SOLN
INTRAMUSCULAR | Status: AC
  Filled 2016-10-21: qty 30

## 2016-10-21 MED ORDER — ONDANSETRON HCL 4 MG PO TABS: 4.0000 mg | ORAL_TABLET | ORAL | Status: DC | PRN

## 2016-10-21 MED ORDER — BUPIVACAINE HCL 0.25 % IJ SOLN
INTRAMUSCULAR | Status: DC | PRN
  Administered 2016-10-21: 10 mL

## 2016-10-21 MED ORDER — SENNOSIDES-DOCUSATE SODIUM 8.6-50 MG PO TABS
2.0000 | ORAL_TABLET | ORAL | Status: DC
  Administered 2016-10-22 (×2): 2 via ORAL
  Filled 2016-10-21 (×2): qty 2

## 2016-10-21 MED ORDER — ONDANSETRON HCL 4 MG/2ML IJ SOLN: 4.0000 mg | Freq: Four times a day (QID) | INTRAMUSCULAR | Status: DC | PRN

## 2016-10-21 MED ORDER — FAMOTIDINE 20 MG PO TABS
40.0000 mg | ORAL_TABLET | Freq: Once | ORAL | Status: AC
  Administered 2016-10-21: 40 mg via ORAL
  Filled 2016-10-21: qty 2

## 2016-10-21 MED ORDER — OXYCODONE-ACETAMINOPHEN 5-325 MG PO TABS: 1.0000 | ORAL_TABLET | ORAL | Status: DC | PRN

## 2016-10-21 MED ORDER — OXYTOCIN BOLUS FROM INFUSION
500.0000 mL | Freq: Once | INTRAVENOUS | Status: AC
  Administered 2016-10-21: 500 mL via INTRAVENOUS

## 2016-10-21 MED ORDER — BENZOCAINE-MENTHOL 20-0.5 % EX AERO: 1.0000 "application " | INHALATION_SPRAY | CUTANEOUS | Status: DC | PRN

## 2016-10-21 MED ORDER — DIPHENHYDRAMINE HCL 25 MG PO CAPS: 25.0000 mg | ORAL_CAPSULE | Freq: Four times a day (QID) | ORAL | Status: DC | PRN

## 2016-10-21 MED ORDER — OXYTOCIN 40 UNITS IN LACTATED RINGERS INFUSION - SIMPLE MED
2.5000 [IU]/h | INTRAVENOUS | Status: DC
  Filled 2016-10-21: qty 1000

## 2016-10-21 MED ORDER — LACTATED RINGERS IV SOLN
INTRAVENOUS | Status: DC
  Administered 2016-10-21: 08:00:00 via INTRAVENOUS

## 2016-10-21 MED ORDER — SIMETHICONE 80 MG PO CHEW: 80.0000 mg | CHEWABLE_TABLET | ORAL | Status: DC | PRN

## 2016-10-21 MED ORDER — MIDAZOLAM HCL 2 MG/2ML IJ SOLN
INTRAMUSCULAR | Status: AC
  Filled 2016-10-21: qty 2

## 2016-10-21 MED ORDER — PROPOFOL 10 MG/ML IV BOLUS
INTRAVENOUS | Status: AC
  Filled 2016-10-21: qty 20

## 2016-10-21 MED ORDER — OXYCODONE-ACETAMINOPHEN 5-325 MG PO TABS: 2.0000 | ORAL_TABLET | ORAL | Status: DC | PRN

## 2016-10-21 MED ORDER — COCONUT OIL OIL: 1.0000 "application " | TOPICAL_OIL | Status: DC | PRN

## 2016-10-21 MED ORDER — LACTATED RINGERS IV SOLN
500.0000 mL | INTRAVENOUS | Status: DC | PRN
  Administered 2016-10-21: 500 mL via INTRAVENOUS

## 2016-10-21 MED ORDER — PRENATAL MULTIVITAMIN CH
1.0000 | ORAL_TABLET | Freq: Every day | ORAL | Status: DC
  Administered 2016-10-22: 1 via ORAL
  Filled 2016-10-21: qty 1

## 2016-10-21 MED ORDER — OXYCODONE HCL 5 MG PO TABS
5.0000 mg | ORAL_TABLET | ORAL | Status: DC | PRN
  Administered 2016-10-22: 5 mg via ORAL
  Filled 2016-10-21 (×2): qty 1

## 2016-10-21 MED ORDER — FENTANYL CITRATE (PF) 100 MCG/2ML IJ SOLN
INTRAMUSCULAR | Status: DC | PRN
  Administered 2016-10-21: 50 ug via INTRAVENOUS

## 2016-10-21 MED ORDER — EPHEDRINE 5 MG/ML INJ
10.0000 mg | INTRAVENOUS | Status: DC | PRN
  Filled 2016-10-21: qty 4

## 2016-10-21 MED ORDER — SODIUM BICARBONATE 8.4 % IV SOLN
INTRAVENOUS | Status: DC | PRN
  Administered 2016-10-21 (×4): 5 mL via EPIDURAL

## 2016-10-21 MED ORDER — ACETAMINOPHEN 325 MG PO TABS: 650.0000 mg | ORAL_TABLET | ORAL | Status: DC | PRN

## 2016-10-21 MED ORDER — WITCH HAZEL-GLYCERIN EX PADS: 1.0000 "application " | MEDICATED_PAD | CUTANEOUS | Status: DC | PRN

## 2016-10-21 MED ORDER — FENTANYL CITRATE (PF) 100 MCG/2ML IJ SOLN
INTRAMUSCULAR | Status: AC
  Filled 2016-10-21: qty 2

## 2016-10-21 MED ORDER — ONDANSETRON HCL 4 MG/2ML IJ SOLN
4.0000 mg | INTRAMUSCULAR | Status: DC | PRN
Start: 2016-10-21 — End: 2016-10-23

## 2016-10-21 MED ORDER — LIDOCAINE-EPINEPHRINE (PF) 2 %-1:200000 IJ SOLN
INTRAMUSCULAR | Status: AC
  Filled 2016-10-21: qty 20

## 2016-10-21 MED ORDER — DIPHENHYDRAMINE HCL 50 MG/ML IJ SOLN: 12.5000 mg | INTRAMUSCULAR | Status: DC | PRN

## 2016-10-21 MED ORDER — TERBUTALINE SULFATE 1 MG/ML IJ SOLN
0.2500 mg | Freq: Once | INTRAMUSCULAR | Status: DC | PRN
  Filled 2016-10-21: qty 1

## 2016-10-21 MED ORDER — OXYTOCIN 40 UNITS IN LACTATED RINGERS INFUSION - SIMPLE MED
1.0000 m[IU]/min | INTRAVENOUS | Status: DC
  Administered 2016-10-21: 2 m[IU]/min via INTRAVENOUS

## 2016-10-21 MED ORDER — ONDANSETRON HCL 4 MG/2ML IJ SOLN
INTRAMUSCULAR | Status: DC | PRN
  Administered 2016-10-21: 4 mg via INTRAVENOUS

## 2016-10-21 MED ORDER — PHENYLEPHRINE 40 MCG/ML (10ML) SYRINGE FOR IV PUSH (FOR BLOOD PRESSURE SUPPORT)
80.0000 ug | PREFILLED_SYRINGE | INTRAVENOUS | Status: DC | PRN
Start: 2016-10-21 — End: 2016-10-21
  Filled 2016-10-21: qty 5

## 2016-10-21 MED ORDER — FENTANYL 2.5 MCG/ML BUPIVACAINE 1/10 % EPIDURAL INFUSION (WH - ANES)
14.0000 mL/h | INTRAMUSCULAR | Status: DC | PRN
  Administered 2016-10-21 (×2): 14 mL/h via EPIDURAL

## 2016-10-21 MED ORDER — IBUPROFEN 600 MG PO TABS
600.0000 mg | ORAL_TABLET | Freq: Four times a day (QID) | ORAL | Status: DC
  Administered 2016-10-22 – 2016-10-23 (×6): 600 mg via ORAL
  Filled 2016-10-21 (×6): qty 1

## 2016-10-21 MED ORDER — FENTANYL 2.5 MCG/ML BUPIVACAINE 1/10 % EPIDURAL INFUSION (WH - ANES)
INTRAMUSCULAR | Status: AC
  Filled 2016-10-21: qty 100

## 2016-10-21 MED ORDER — FAMOTIDINE 20 MG PO TABS
20.0000 mg | ORAL_TABLET | Freq: Every day | ORAL | Status: DC
  Administered 2016-10-22: 20 mg via ORAL
  Filled 2016-10-21: qty 1

## 2016-10-21 MED ORDER — ACETAMINOPHEN 325 MG PO TABS
650.0000 mg | ORAL_TABLET | ORAL | Status: DC | PRN
  Administered 2016-10-21 – 2016-10-22 (×2): 650 mg via ORAL
  Filled 2016-10-21 (×2): qty 2

## 2016-10-21 MED ORDER — LIDOCAINE HCL (PF) 1 % IJ SOLN
INTRAMUSCULAR | Status: DC | PRN
  Administered 2016-10-21: 4 mL
  Administered 2016-10-21: 6 mL via EPIDURAL

## 2016-10-21 MED ORDER — METOCLOPRAMIDE HCL 10 MG PO TABS
10.0000 mg | ORAL_TABLET | Freq: Once | ORAL | Status: AC
  Administered 2016-10-21: 10 mg via ORAL
  Filled 2016-10-21: qty 1

## 2016-10-21 MED ORDER — SOD CITRATE-CITRIC ACID 500-334 MG/5ML PO SOLN
30.0000 mL | ORAL | Status: DC | PRN
  Administered 2016-10-21: 30 mL via ORAL
  Filled 2016-10-21: qty 15

## 2016-10-21 MED ORDER — OXYCODONE HCL 5 MG PO TABS
10.0000 mg | ORAL_TABLET | ORAL | Status: DC | PRN
  Administered 2016-10-22: 10 mg via ORAL
  Filled 2016-10-21: qty 2

## 2016-10-21 MED ORDER — DIBUCAINE 1 % RE OINT: 1.0000 "application " | TOPICAL_OINTMENT | RECTAL | Status: DC | PRN

## 2016-10-21 MED ORDER — ZOLPIDEM TARTRATE 5 MG PO TABS: 5.0000 mg | ORAL_TABLET | Freq: Every evening | ORAL | Status: DC | PRN

## 2016-10-21 MED ORDER — BUTORPHANOL TARTRATE 1 MG/ML IJ SOLN: 1.0000 mg | INTRAMUSCULAR | Status: DC | PRN

## 2016-10-21 MED ORDER — LIDOCAINE HCL (CARDIAC) 20 MG/ML IV SOLN
INTRAVENOUS | Status: AC
  Filled 2016-10-21: qty 5

## 2016-10-21 MED ORDER — LIDOCAINE HCL (PF) 1 % IJ SOLN
30.0000 mL | INTRAMUSCULAR | Status: DC | PRN
  Filled 2016-10-21: qty 30

## 2016-10-21 MED ORDER — PHENYLEPHRINE 40 MCG/ML (10ML) SYRINGE FOR IV PUSH (FOR BLOOD PRESSURE SUPPORT)
PREFILLED_SYRINGE | INTRAVENOUS | Status: AC
  Filled 2016-10-21: qty 20

## 2016-10-21 MED ORDER — MIDAZOLAM HCL 2 MG/2ML IJ SOLN
INTRAMUSCULAR | Status: DC | PRN
  Administered 2016-10-21 (×2): 1 mg via INTRAVENOUS

## 2016-10-21 MED ORDER — LACTATED RINGERS IV SOLN
INTRAVENOUS | Status: DC
  Administered 2016-10-21: 15:00:00 via INTRAVENOUS

## 2016-10-21 SURGICAL SUPPLY — 31 items
APL SKNCLS STERI-STRIP NONHPOA (GAUZE/BANDAGES/DRESSINGS) ×1
BENZOIN TINCTURE PRP APPL 2/3 (GAUZE/BANDAGES/DRESSINGS) ×2 IMPLANT
BLADE SURG 11 STRL SS (BLADE) ×2 IMPLANT
CLOSURE WOUND 1/2 X4 (GAUZE/BANDAGES/DRESSINGS) ×1
CLOSURE WOUND 1/4X4 (GAUZE/BANDAGES/DRESSINGS) ×1
CLOTH BEACON ORANGE TIMEOUT ST (SAFETY) ×2 IMPLANT
CONTAINER PREFILL 10% NBF 15ML (MISCELLANEOUS) ×4 IMPLANT
DRSG OPSITE POSTOP 3X4 (GAUZE/BANDAGES/DRESSINGS) ×2 IMPLANT
DURAPREP 26ML APPLICATOR (WOUND CARE) ×2 IMPLANT
ELECT REM PT RETURN 9FT ADLT (ELECTROSURGICAL) ×3
ELECTRODE REM PT RTRN 9FT ADLT (ELECTROSURGICAL) IMPLANT
GLOVE BIO SURGEON STRL SZ 6.5 (GLOVE) ×2 IMPLANT
GLOVE BIO SURGEONS STRL SZ 6.5 (GLOVE) ×2
GLOVE BIOGEL PI IND STRL 7.0 (GLOVE) IMPLANT
GLOVE BIOGEL PI INDICATOR 7.0 (GLOVE) ×2
GOWN STRL REUS W/TWL LRG LVL3 (GOWN DISPOSABLE) ×4 IMPLANT
NEEDLE HYPO 22GX1.5 SAFETY (NEEDLE) IMPLANT
NS IRRIG 1000ML POUR BTL (IV SOLUTION) ×2 IMPLANT
PACK ABDOMINAL MINOR (CUSTOM PROCEDURE TRAY) ×2 IMPLANT
PENCIL BUTTON HOLSTER BLD 10FT (ELECTRODE) IMPLANT
PROTECTOR NERVE ULNAR (MISCELLANEOUS) ×2 IMPLANT
SPONGE LAP 4X18 X RAY DECT (DISPOSABLE) ×2 IMPLANT
STRIP CLOSURE SKIN 1/2X4 (GAUZE/BANDAGES/DRESSINGS) ×1 IMPLANT
STRIP CLOSURE SKIN 1/4X4 (GAUZE/BANDAGES/DRESSINGS) ×1 IMPLANT
SUT PLAIN 0 NONE (SUTURE) ×2 IMPLANT
SUT VIC AB 0 CT1 27 (SUTURE) ×3
SUT VIC AB 0 CT1 27XBRD ANBCTR (SUTURE) IMPLANT
SUT VIC AB 3-0 FS2 27 (SUTURE) ×2 IMPLANT
SYR CONTROL 10ML LL (SYRINGE) IMPLANT
TOWEL OR 17X24 6PK STRL BLUE (TOWEL DISPOSABLE) ×4 IMPLANT
TRAY FOLEY CATH SILVER 14FR (SET/KITS/TRAYS/PACK) ×2 IMPLANT

## 2016-10-21 NOTE — Transfer of Care (Signed)
Immediate Anesthesia Transfer of Care Note  Patient: Raeanne GathersBrandy S Smelser  Procedure(s) Performed: Procedure(s): POST PARTUM TUBAL LIGATION (Bilateral)  Patient Location: PACU  Anesthesia Type:Epidural  Level of Consciousness: awake, alert  and oriented  Airway & Oxygen Therapy: Patient Spontanous Breathing  Post-op Assessment: Report given to RN and Post -op Vital signs reviewed and stable  Post vital signs: Reviewed and stable  Last Vitals:  Vitals:   10/21/16 1615 10/21/16 1714  BP: 118/74 116/68  Pulse: 87 93  Resp: 20 20  Temp: 36.8 C 36.8 C    Last Pain:  Vitals:   10/21/16 1615  TempSrc: Oral  PainSc:          Complications: No apparent anesthesia complications

## 2016-10-21 NOTE — Anesthesia Preprocedure Evaluation (Addendum)
Anesthesia Evaluation  Patient identified by MRN, date of birth, ID band Patient awake    Reviewed: Allergy & Precautions, H&P , Patient's Chart, lab work & pertinent test results  Airway Mallampati: II  TM Distance: >3 FB Neck ROM: full    Dental no notable dental hx.    Pulmonary former smoker,    Pulmonary exam normal breath sounds clear to auscultation       Cardiovascular Exercise Tolerance: Good  Rhythm:regular Rate:Normal     Neuro/Psych    GI/Hepatic   Endo/Other  Morbid obesity  Renal/GU      Musculoskeletal   Abdominal   Peds  Hematology   Anesthesia Other Findings   Reproductive/Obstetrics                            Anesthesia Physical Anesthesia Plan  ASA: III  Anesthesia Plan: Epidural   Post-op Pain Management:    Induction:   Airway Management Planned:   Additional Equipment:   Intra-op Plan:   Post-operative Plan:   Informed Consent: I have reviewed the patients History and Physical, chart, labs and discussed the procedure including the risks, benefits and alternatives for the proposed anesthesia with the patient or authorized representative who has indicated his/her understanding and acceptance.   Dental Advisory Given  Plan Discussed with:   Anesthesia Plan Comments: (Labs checked- platelets confirmed with RN in room. Fetal heart tracing, per RN, reported to be stable enough for sitting procedure. Discussed epidural, and patient consents to the procedure:  included risk of possible headache,backache, failed block, allergic reaction, and nerve injury. This patient was asked if she had any questions or concerns before the procedure started.)        Anesthesia Quick Evaluation

## 2016-10-21 NOTE — Addendum Note (Signed)
Addendum  created 10/21/16 2208 by Shanon PayorSuzanne M Tyshana Nishida, CRNA   Charge Capture section accepted, Sign clinical note, Visit diagnoses modified

## 2016-10-21 NOTE — Brief Op Note (Signed)
10/21/2016  6:26 PM  PATIENT:  Raeanne GathersBrandy S Buras  35 y.o. female  PRE-OPERATIVE DIAGNOSIS:  PP BTL, undesired fertility  POST-OPERATIVE DIAGNOSIS:  post partum tubal ligation, desires sterilization  PROCEDURE:  Procedure(s): POST PARTUM TUBAL LIGATION (Bilateral) by Bilateral salpingectomy  SURGEON:  Surgeon(s) and Role:    * Linzie Boursiquot [name], MD - Primary  ANESTHESIA:   local and epidural  EBL:  Total I/O In: 450 [I.V.:450] Out: 965 [Urine:955; Blood:10]  BLOOD ADMINISTERED:none  DRAINS: Urinary Catheter (Foley) d/c in PACU   LOCAL MEDICATIONS USED:  BUPIVICAINE  and Amount: 10 ml  SPECIMEN:  Source of Specimen:  B tubal segments  DISPOSITION OF SPECIMEN:  PATHOLOGY  COUNTS:  YES  TOURNIQUET:  * No tourniquets in log *  DICTATION: .Other Dictation: Dictation Number no # given  PLAN OF CARE: Admit to inpatient   PATIENT DISPOSITION:  PACU - hemodynamically stable.   Delay start of Pharmacological VTE agent (>24hrs) due to surgical blood loss or risk of bleeding: not applicable

## 2016-10-21 NOTE — Anesthesia Postprocedure Evaluation (Signed)
Anesthesia Post Note  Patient: Sonya Phillips  Procedure(s) Performed: Procedure(s) (LRB): POST PARTUM TUBAL LIGATION (Bilateral)  Patient location during evaluation: Mother Baby Anesthesia Type: Epidural Level of consciousness: awake and alert and oriented Pain management: satisfactory to patient Vital Signs Assessment: post-procedure vital signs reviewed and stable Respiratory status: spontaneous breathing and nonlabored ventilation Cardiovascular status: stable Postop Assessment: no headache, no backache, no signs of nausea or vomiting, adequate PO intake and patient able to bend at knees (patient up walking) Anesthetic complications: no     Last Vitals:  Vitals:   10/21/16 1937 10/21/16 2040  BP: 120/67 112/67  Pulse: 84 88  Resp: 15 16  Temp: 36.8 C 36.9 C    Last Pain:  Vitals:   10/21/16 2145  TempSrc:   PainSc: 2    Pain Goal: Patients Stated Pain Goal: 4 (10/21/16 1843)               Madison HickmanGREGORY,Dianne Bady

## 2016-10-21 NOTE — Anesthesia Procedure Notes (Signed)

## 2016-10-21 NOTE — Anesthesia Pain Management Evaluation Note (Signed)
  [facility] Pain Management Visit Note  Patient: Sonya Phillips, 35 y.o., female  "Hello I am a [facility]. We have an anesthesia team available at all times to provide care throughout the hospital, including epidural management and anesthesia for C-section. I don't know your plan for the delivery whether it a natural birth, water birth, IV sedation, nitrous supplementation, doula or epidural, but we want to meet your pain goals."   1.Was your pain managed to your expectations on prior hospitalizations?   Yes   2.What is your expectation for pain management during this hospitalization?     Epidural  3.How can we help you reach that goal? Epidural when ready  Record the patient's initial score and the patient's pain goal.   Pain: 2  Pain Goal: 6 [facility] wants you to be able to say your pain was always managed very well.  Edison PaceWILKERSON,Denzil Bristol 10/21/2016

## 2016-10-21 NOTE — Lactation Note (Signed)
This note was copied from a baby's chart. Lactation Consultation Note  Patient Name: Sonya Phillips WUJWJ'XToday's Date: 10/21/2016 Reason for consult: Initial assessment Baby is 3 hours old, and has been to the breast x 4 ( 5-15 mins , including this consult - latch)  Per mom the baby just ate 10 mins, baby still showing feeding cues . LC offered to assist with latch  And mom accepted assistance. LC noted mom allowing the baby to nibble it's way on. LC reviewed basics  And stressed the importance of guiding the baby by tickling the upper lip until the baby opened wide so the  Baby would learn to open wide , therefore stimulating the areola more to enhance the let down. Swallows noted  One depth achieved. Baby fed for 6 mins. And released, nipple well rounded.  Per mom going to surgery at 4 pm for a tubal. LC offered to provide colostrum collector and LC could walk mom through  Hand expressing. Per mom hand expressing never really worked for me in the past.  LC mentioned due to baby feeding x 4 , also baby not stooling yet, may just sleep while she is in surgery.  Mother informed of post-discharge support and given phone number to the lactation department, including services for phone call assistance; out-patient appointments; and breastfeeding support group. List of other breastfeeding resources in the community given in the handout. Encouraged mother to call for problems or concerns related to breastfeeding.    Maternal Data Does the patient have breastfeeding experience prior to this delivery?: Yes  Feeding Feeding Type: Breast Fed Length of feed: 6 min (swallows noted )  LATCH Score/Interventions Latch: Grasps breast easily, tongue down, lips flanged, rhythmical sucking.  Audible Swallowing: A few with stimulation Intervention(s): Skin to skin;Hand expression  Type of Nipple: Everted at rest and after stimulation  Comfort (Breast/Nipple): Soft / non-tender     Hold (Positioning):  Assistance needed to correctly position infant at breast and maintain latch. Intervention(s): Breastfeeding basics reviewed;Support Pillows;Position options;Skin to skin  LATCH Score: 8  Lactation Tools Discussed/Used     Consult Status Consult Status: Follow-up Date: 10/15/16 Follow-up type: In-patient    Matilde SprangMargaret Ann Armanie Martine 10/21/2016, 4:02 PM

## 2016-10-21 NOTE — Anesthesia Postprocedure Evaluation (Signed)
Anesthesia Post Note  Patient: Raeanne GathersBrandy S Hourihan  Procedure(s) Performed: * No procedures listed *  Patient location during evaluation: Mother Baby Anesthesia Type: Epidural Level of consciousness: awake and alert Pain management: pain level controlled Vital Signs Assessment: post-procedure vital signs reviewed and stable Respiratory status: spontaneous breathing, nonlabored ventilation and respiratory function stable Cardiovascular status: stable Postop Assessment: no headache, no backache and epidural receding Anesthetic complications: no     Last Vitals:  Vitals:   10/21/16 1615 10/21/16 1714  BP: 118/74 116/68  Pulse: 87 93  Resp: 20 20  Temp: 36.8 C 36.8 C    Last Pain:  Vitals:   10/21/16 1615  TempSrc: Oral  PainSc:    Pain Goal:                 Jiles GarterJACKSON,Santasia Rew EDWARD

## 2016-10-21 NOTE — Anesthesia Postprocedure Evaluation (Signed)
Anesthesia Post Note  Patient: Sonya Phillips  Procedure(s) Performed: Procedure(s) (LRB): POST PARTUM TUBAL LIGATION (Bilateral)  Patient location during evaluation: PACU Anesthesia Type: Epidural Level of consciousness: awake and alert Pain management: pain level controlled Vital Signs Assessment: post-procedure vital signs reviewed and stable Respiratory status: spontaneous breathing and respiratory function stable Cardiovascular status: blood pressure returned to baseline and stable Postop Assessment: no headache, no backache and epidural receding Anesthetic complications: no     Last Vitals:  Vitals:   10/21/16 1845 10/21/16 1937  BP: 116/67 120/67  Pulse: 84 84  Resp: 20 15  Temp:  36.8 C    Last Pain:  Vitals:   10/21/16 1937  TempSrc:   PainSc: 3    Pain Goal: Patients Stated Pain Goal: 4 (10/21/16 1843)               Phillips Groutarignan, Sanah Kraska

## 2016-10-21 NOTE — Anesthesia Preprocedure Evaluation (Signed)
Anesthesia Evaluation  Patient identified by MRN, date of birth, ID band Patient awake    Reviewed: Allergy & Precautions, H&P , Patient's Chart, lab work & pertinent test results  Airway Mallampati: II  TM Distance: >3 FB Neck ROM: full    Dental no notable dental hx.    Pulmonary former smoker,    Pulmonary exam normal breath sounds clear to auscultation       Cardiovascular Exercise Tolerance: Good  Rhythm:regular Rate:Normal     Neuro/Psych    GI/Hepatic   Endo/Other  Morbid obesity  Renal/GU      Musculoskeletal   Abdominal   Peds  Hematology   Anesthesia Other Findings   Reproductive/Obstetrics                            Anesthesia Physical Anesthesia Plan  ASA: III  Anesthesia Plan: Epidural   Post-op Pain Management:    Induction:   Airway Management Planned:   Additional Equipment:   Intra-op Plan:   Post-operative Plan:   Informed Consent: I have reviewed the patients History and Physical, chart, labs and discussed the procedure including the risks, benefits and alternatives for the proposed anesthesia with the patient or authorized representative who has indicated his/her understanding and acceptance.   Dental Advisory Given  Plan Discussed with:   Anesthesia Plan Comments: (Labs checked- platelets confirmed with RN in room. Fetal heart tracing, per RN, reported to be stable enough for sitting procedure. Discussed epidural, and patient consents to the procedure:  included risk of possible headache,backache, failed block, allergic reaction, and nerve injury. This patient was asked if she had any questions or concerns before the procedure started.)        Anesthesia Quick Evaluation  

## 2016-10-21 NOTE — Anesthesia Postprocedure Evaluation (Signed)
Anesthesia Post Note  Patient: Raeanne GathersBrandy S Hellard  Procedure(s) Performed: * No procedures listed *  Patient location during evaluation: Mother Baby Anesthesia Type: Epidural Level of consciousness: awake and alert and oriented Pain management: satisfactory to patient Vital Signs Assessment: post-procedure vital signs reviewed and stable Respiratory status: spontaneous breathing and nonlabored ventilation Cardiovascular status: stable Postop Assessment: no headache, no backache, no signs of nausea or vomiting, adequate PO intake and patient able to bend at knees (patient up walking) Anesthetic complications: no     Last Vitals:  Vitals:   10/21/16 1937 10/21/16 2040  BP: 120/67 112/67  Pulse: 84 88  Resp: 15 16  Temp: 36.8 C 36.9 C    Last Pain:  Vitals:   10/21/16 2145  TempSrc:   PainSc: 2    Pain Goal: Patients Stated Pain Goal: 4 (10/21/16 1843)               Madison HickmanGREGORY,Jarika Robben

## 2016-10-21 NOTE — Progress Notes (Addendum)
Patient ID: Sonya GathersBrandy S Phillips, female   DOB: 03/26/1981, 35 y.o.   MRN: [id number]   H&P reviewed, no changes  No c/o's. Starting to feel ctx.  AFVSS gen NAD FHTs 140's, good var, category 1 toco q 2-264min  AROM for copious clear fluid, w/o diff/comp  6/70/-2  Epidural prn Expect SVD

## 2016-10-22 LAB — CBC
HCT: 31.9 % — ABNORMAL LOW (ref 36.0–46.0)
HEMOGLOBIN: 10.4 g/dL — AB (ref 12.0–15.0)
MCH: 27.2 pg (ref 26.0–34.0)
MCHC: 32.6 g/dL (ref 30.0–36.0)
MCV: 83.3 fL (ref 78.0–100.0)
PLATELETS: 145 10*3/uL — AB (ref 150–400)
RBC: 3.83 MIL/uL — AB (ref 3.87–5.11)
RDW: 15.3 % (ref 11.5–15.5)
WBC: 9 10*3/uL (ref 4.0–10.5)

## 2016-10-22 NOTE — Lactation Note (Signed)
This note was copied from a baby's chart. Lactation Consultation Note  Patient Name: Sonya Laqueta JeanBrandy Ducat XBJYN'WToday's Date: 10/22/2016 Reason for consult: Follow-up assessment (per mom baby ate im the last hour )  Baby is 25 hours and baby has been consistent at the breast since birth. Per mom the baby  Has been feeding well and denies sore nipples, also hearing increased swallows.  Presently baby lying in the bedside crib sleeping.  LC answered mom's questions of when should she start pumping after she goes home.  LC discussed keeping breast feeding simple, baby only has lost 2 %.  LC discussed since the abby only has lost 2 % of his birth weight he may have several feedings  Where he only feeds 1st breast. When the milk comes in ( breast are warmer, fuller , heavier ) it is a good  Sign , volume is increasing and the fat content of milk is increasing. Discussed Prevention of soreness and  Engorgement. Also the importance of releasing the 1st breast is to full , so baby will be able to obtain the depth  At the breast , soften well , offer the 2nd breast, if baby only feeds 1st breast don't get overly concerned , just Release down to comfort to protect establishing milk supply. Per mom has a DEBP at home.  Mom also asked when do you introduce a bottle. LC mentioned 4-6 weeks , one a day for a feeding and replace with pumping both  Breast for 15 -20 mins, save milk . To build milk supply around 4-6 weeks post pump after 3 - 4 feedings 10 -15 mins.  LC encouraged mom to call if needing assistance to latch.    Maternal Data Has patient been taught Hand Expression?: No  Feeding Feeding Type: Breast Fed Length of feed: 10 min (per mom)  LATCH Score/Interventions                Intervention(s): Breastfeeding basics reviewed     Lactation Tools Discussed/Used     Consult Status Consult Status: Follow-up Date: 10/23/16 Follow-up type: In-patient    Sonya SprangMargaret Ann  [name] 10/22/2016, 2:12 PM

## 2016-10-22 NOTE — Progress Notes (Signed)
PPD #1, POD #1 BTL No problems Afeb, VSS Fundus firm, NT at U-1, incision intact Continue routine postpartum care

## 2016-10-23 MED ORDER — IBUPROFEN 800 MG PO TABS: 800.0000 mg | ORAL_TABLET | Freq: Three times a day (TID) | ORAL | 1 refills | Status: DC | PRN

## 2016-10-23 MED ORDER — OXYCODONE HCL 5 MG PO TABS: 5.0000 mg | ORAL_TABLET | ORAL | 0 refills | Status: DC | PRN

## 2016-10-23 NOTE — Discharge Instructions (Signed)
Nothing in vagina for 6 weeks.  No sex, tampons, and douching.  Other instructions as in [facility] Discharge Booklet. °

## 2016-10-23 NOTE — Progress Notes (Signed)
Patient ID: Sonya Phillips, female   DOB: 04/02/1981, 35 y.o.   MRN: [id number] Pt doing well. Sore but pain controlled with meds. Has no complaints. Bonding well with baby. Breastfeeding. Ready for discharge to home today VSS ABD- soft, FF, incisions c/d/i EXT - no edema  A/P: PPD # 2 and and s/p BTl; - stable        Discharge instructions reviewed        F/u in 6weeks

## 2016-10-23 NOTE — Lactation Note (Signed)
This note was copied from a baby's chart. Lactation Consultation Note  Patient Name: Boy Laqueta JeanBrandy Kesling ONGEX'BToday's Date: 10/23/2016 Reason for consult: Follow-up assessment (5% weight loss, ) Baby is 44 hours old and per mom last fed at 0755 for 10 mins .  LC asked mom what is the baby doing at 10 mins. Per mom  Baby gets sleepy so I take him off, some feedings STS , some with clothes.  LC highly recommended STS feedings until the baby can stay awake for a  Feeding and gaining back up to birth weight. LC reassured mom the baby  is only at 5 % weight loss.  Yesterday LC reviewed sore nipple and engorgement prevention and tx. Per mom has a good understanding Pt  prevention and tx. Per mom has a DEBP at home. LC mentioned to mom if her breast are fuller , warmer , heavier  By 3-4 days to add post pumping after a few feedings to enhance milk volume coming in .  Mother informed of post-discharge support and given phone number to the lactation department, including services for phone  call assistance; out-patient appointments; and breastfeeding support group. List of other breastfeeding resources in the community  given in the handout. Encouraged mother to call for problems or concerns related to breastfeeding.   Maternal Data Has patient been taught Hand Expression?:  (1st LC consult - per mom hand expressing never really worked for me., mom declined assist for hand expressing )  Feeding Feeding Type:  (per mom baby last fed at 0755 for 10 mins ) Length of feed: 10 min (increased swallows per mom )  LATCH Score/Interventions                Intervention(s): Breastfeeding basics reviewed     Lactation Tools Discussed/Used     Consult Status Consult Status: Complete Date: 10/23/16    Matilde SprangMargaret Ann Danial Hlavac 10/23/2016, 9:06 AM

## 2016-10-23 NOTE — Discharge Summary (Signed)
OB Discharge Summary     Patient Name: Sonya Phillips DOB: 11/04/1981 MRN: [id number]  Date of admission: 10/21/2016 Delivering MD: Sonya Phillips   Date of discharge: 10/23/2016  Admitting diagnosis: INDUCTION PP BTL Intrauterine pregnancy: 8316w2d     Secondary diagnosis:  Principal Problem:   SVD (spontaneous vaginal delivery) Active Problems:   AMA (advanced maternal age) multigravida 35+, third trimester   S/P tubal ligation  Additional problems: none     Discharge diagnosis: Term Pregnancy Delivered                                                                                                Post partum procedures:postpartum tubal ligation  Augmentation: AROM and Pitocin  Complications: None  Hospital course:  Induction of Labor With Vaginal Delivery   35 y.o. yo [id number] at 2516w2d was admitted to the hospital 10/21/2016 for induction of labor.  Indication for induction: Favorable cervix at term.  Patient had an uncomplicated labor course as follows: Membrane Rupture Time/Date: 8:40 AM ,10/21/2016   Intrapartum Procedures: Episiotomy: None [1]                                         Lacerations:  None [1]  Patient had delivery of a Viable infant.  Information for the patient's newborn:  Sonya Phillips, Sonya Phillips [[id number]][[id number]]  Delivery Method: Vag-Spont   10/21/2016  Details of delivery can be found in separate delivery note.  Patient had a routine postpartum course. Patient is discharged home 10/23/16.   Physical exam Vitals:   10/22/16 1300 10/22/16 1640 10/22/16 1940 10/23/16 0538  BP: 114/66 137/70 124/76 130/66  Pulse: 74 85 78 71  Resp: 18 16 18 18   Temp: 98.1 F (36.7 C) 98.2 F (36.8 C) 98.4 F (36.9 C) 98.1 F (36.7 C)  TempSrc: Oral Oral Oral Oral  SpO2: 98%     Weight:      Height:       General: alert, cooperative and no distress Lochia: appropriate Uterine Fundus: firm Incision: Healing well with no significant drainage DVT Evaluation: No  evidence of DVT seen on physical exam. Labs: Lab Results  Component Value Date   WBC 9.0 10/22/2016   HGB 10.4 (L) 10/22/2016   HCT 31.9 (L) 10/22/2016   MCV 83.3 10/22/2016   PLT 145 (L) 10/22/2016   No flowsheet data found.  Discharge instruction: per After Visit Summary and "Baby and Me Booklet".  After visit meds:    Medication List    TAKE these medications   calcium carbonate 500 MG chewable tablet Commonly known as:  TUMS - dosed in mg elemental calcium Chew 2 tablets by mouth daily as needed for indigestion or heartburn.   ibuprofen 800 MG tablet Commonly known as:  ADVIL,MOTRIN Take 1 tablet (800 mg total) by mouth every 8 (eight) hours as needed.   oxyCODONE 5 MG immediate release tablet Commonly known as:  Oxy IR/ROXICODONE Take 1 tablet (5 mg total) by mouth every 4 (  four) hours as needed (pain scale 4-7).   prenatal multivitamin Tabs tablet Take 1 tablet by mouth daily at 12 noon.   ranitidine 150 MG tablet Commonly known as:  ZANTAC Take 150 mg by mouth daily as needed for heartburn.       Diet: routine diet  Activity: Advance as tolerated. Pelvic rest for 6 weeks.   Outpatient follow up:6 weeks Follow up Appt:No future appointments. Follow up Visit:No Follow-up on file.  Postpartum contraception: Tubal Ligation  Newborn Data: Live born female  Birth Weight: 8 lb 14.9 oz (4050 g) APGAR: 8,   Baby Feeding: Breast Disposition:home with mother   10/23/2016 Sonya Phillips

## 2016-10-23 NOTE — Op Note (Signed)
NAMLaqueta Jean:  Phillips, Sonya                ACCOUNT NO.:  [id number]  MEDICAL RECORD NO.:  [id number]  LOCATION:                                 FACILITY:  PHYSICIAN:  Sherron MondayJody Bovard, MD        DATE OF BIRTH:  10/21/2018  DATE OF PROCEDURE: DATE OF DISCHARGE:                              OPERATIVE REPORT   PREOPERATIVE DIAGNOSIS:  Undesired fertility.  POSTOPERATIVE DIAGNOSIS:  Status post bilateral tubal ligation by Bilateral partial salpingectomy.  PROCEDURE:  Postpartum bilateral tubal ligation by bilateral Partial salpingectomy.  ASSISTANT:  None.  ANESTHESIA:  Local and epidural.  IV FLUIDS:  450 mL.  URINE OUTPUT:  955 mL.  EBL:  Approximately 10 mL.  Foley catheter placed to the PACU.  PATHOLOGY:  Bilateral tubal segments.  COMPLICATIONS:  None.  DESCRIPTION OF PROCEDURE:  After informed consent was reviewed the patient and her partner, she was transported to the OR, where her epidural was re-dosed and found to be adequate.  She was then prepped and draped in the normal sterile fashion.  A Foley catheter had previously been placed.  Approximately 2 cm horizontal infraumbilical incision was made.  The underlying fascia was grasped with Kocher clamps, elevated, the rectus muscles were elevated and the fascia was incised.  The posterior sheath was then grasped and elevated and incised.  The hole was enlarged with a Kelly clamp.  The peritoneum was entered bluntly using S retractors.  Her left tube was easily identified followed out to the fimbriated end.  As the fimbriated end was easy to bring up to the incision site A partial salpingectomy including a fimbriectomy was performed.  Doubly ligated with 0 plain gut.  This was noted to be hemostatic.  The end of the tube was excised.  The bowel had been packed away with moist tape, sponge.  Again, the tube was identified on the right side followed out to the fimbriated end and again the fimbria was easy to bring up to the  site of the incision a partial salpingectomy including fimbriectomy was performed.  This was also doubly ligated and noted to be hemostatic.  The fascia was reapproximated with 2-0 Vicryl.  A deep suture of 0 Vicryl was placed.  The skin was reapproximated with 3-0 Vicryl in a subcu fashion.  Benzoin and Steri- Strips were applied.  The patient tolerated the procedure well.  Sponge, lap, and needle counts were correct x2 per the operating staff.     Sherron MondayJody Bovard, MD   ______________________________ Sherron MondayJody Bovard, MD    JB/MEDQ  D:  10/21/2016  T:  10/22/2016  Job:  [id number]

## 2016-10-28 ENCOUNTER — Encounter (HOSPITAL_COMMUNITY): Payer: Self-pay

## 2016-11-26 DIAGNOSIS — Z1389 Encounter for screening for other disorder: Secondary | ICD-10-CM | POA: Diagnosis not present

## 2017-02-11 DIAGNOSIS — Z01419 Encounter for gynecological examination (general) (routine) without abnormal findings: Secondary | ICD-10-CM | POA: Diagnosis not present

## 2017-02-11 DIAGNOSIS — Z124 Encounter for screening for malignant neoplasm of cervix: Secondary | ICD-10-CM | POA: Diagnosis not present

## 2017-02-13 DIAGNOSIS — Z0100 Encounter for examination of eyes and vision without abnormal findings: Secondary | ICD-10-CM | POA: Diagnosis not present

## 2017-10-06 IMAGING — CT CT CERVICAL SPINE W/O CM
4 series · 16 of 33 positions shown, 19 images · non-contrast
Comparison: None.

CLINICAL DATA: One week history of bilateral upper extremity
numbness. No history of recent trauma

EXAM:
CT CERVICAL SPINE WITHOUT CONTRAST
TECHNIQUE: Multidetector CT imaging of the cervical spine was performed without
intravenous contrast. Multiplanar CT image reconstructions were also
generated.

[Series 4: c_spine 2.0 st · axial · 0.26mm/px · z∈[-253,-127]mm · 5 of 95 slices shown, 7 images]
[im 16/95  soft-tissue]
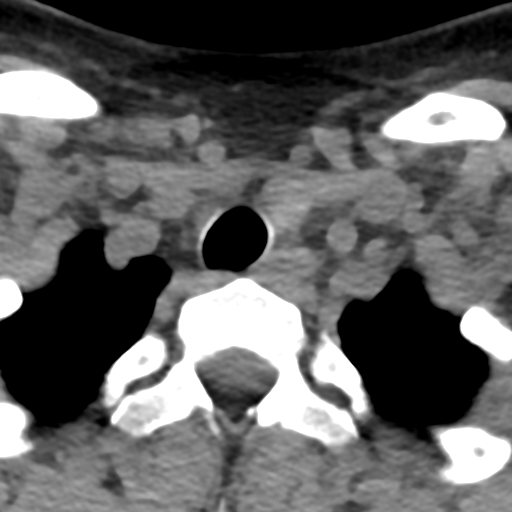
[im 16/95  bone]
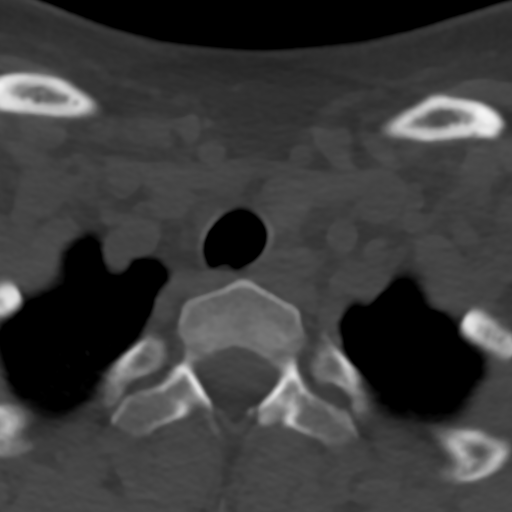
[im 32/95  bone]
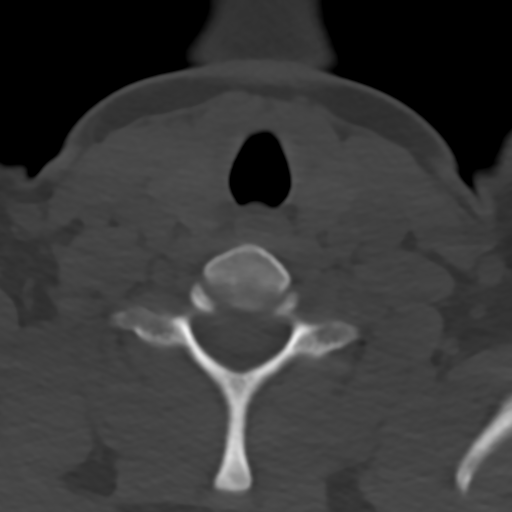
[im 48/95  bone]
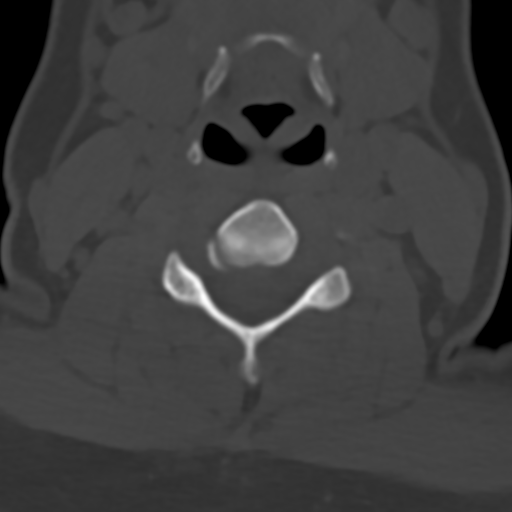
[im 63/95  bone]
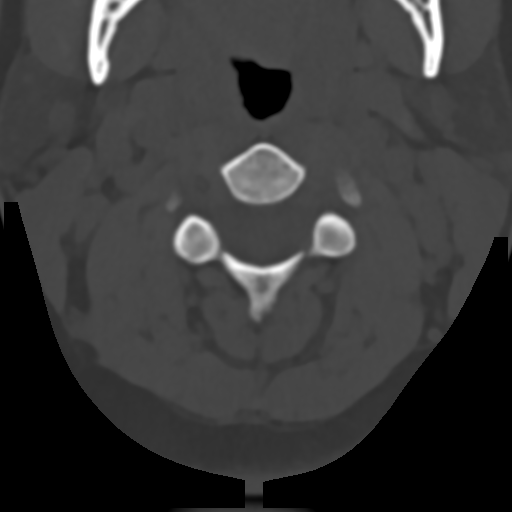
[im 79/95  soft-tissue]
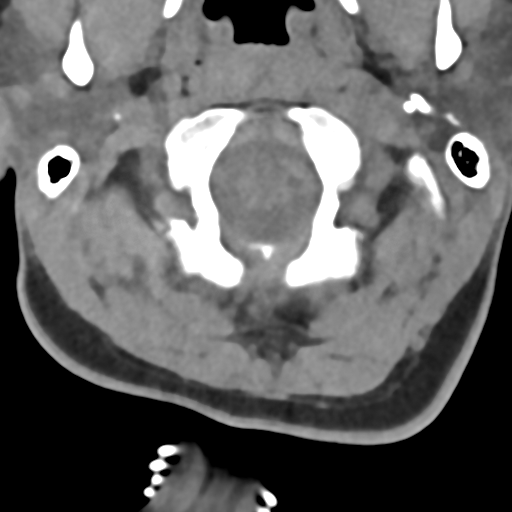
[im 79/95  bone]
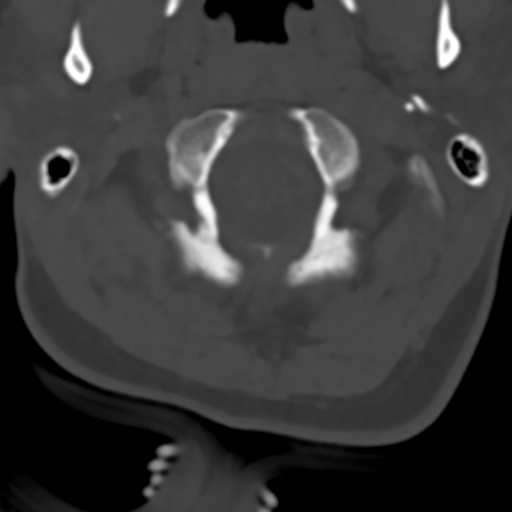

[Series 602: sag bone · sagittal · 0.39mm/px · 5 of 46 slices shown, 6 images]
[im 16/46  bone]
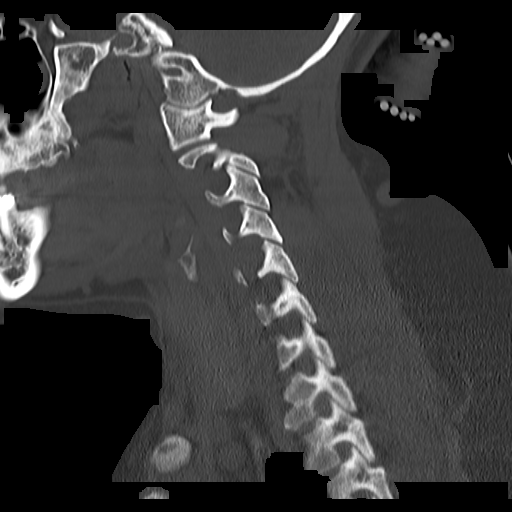
[im 19/46  bone]
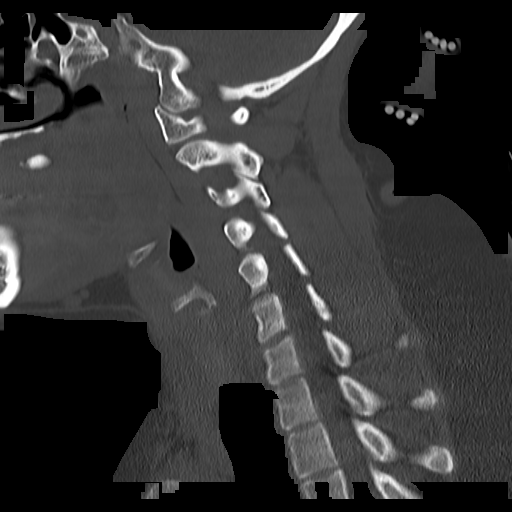
[im 23/46  soft-tissue]
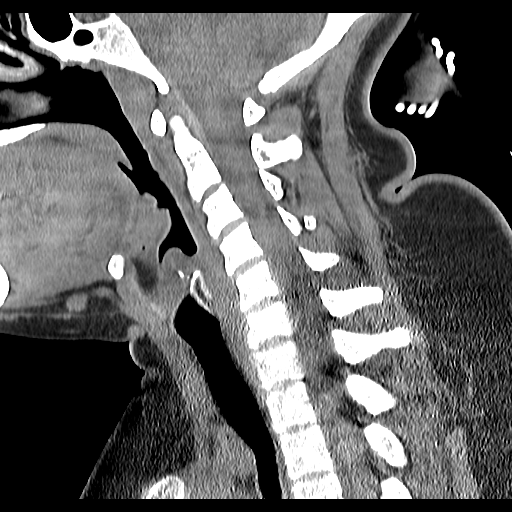
[im 23/46  bone]
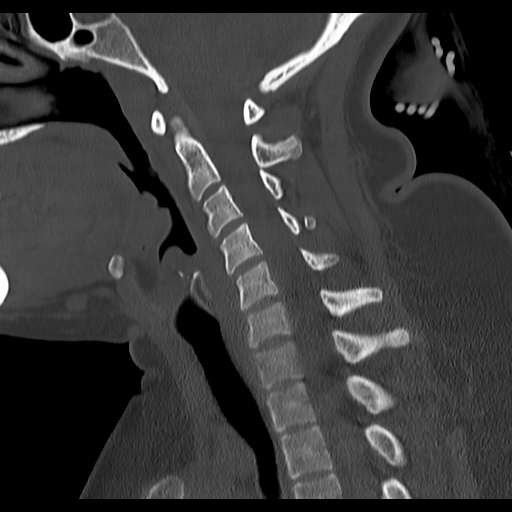
[im 27/46  bone]
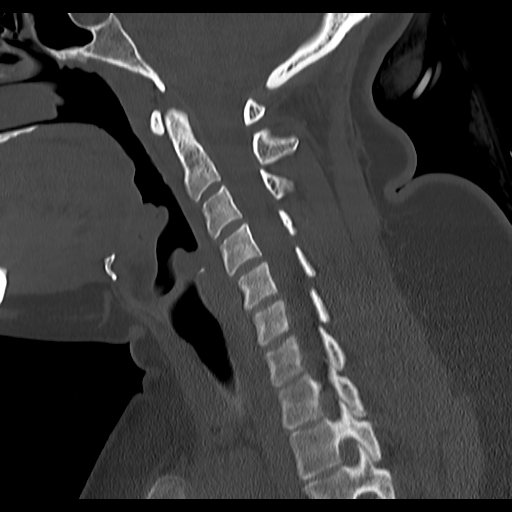
[im 31/46  bone]
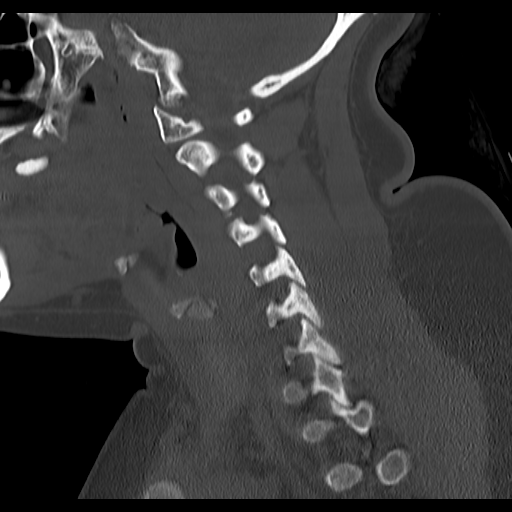

[Series 603: cor bone · coronal · 0.39mm/px · 3 of 46 slices shown]
[im 10/46  bone]
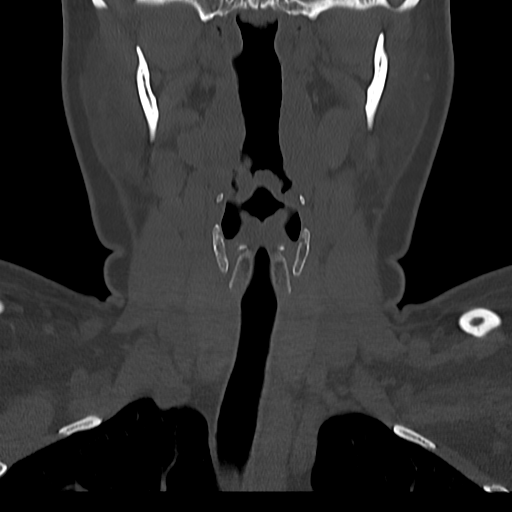
[im 19/46  bone]
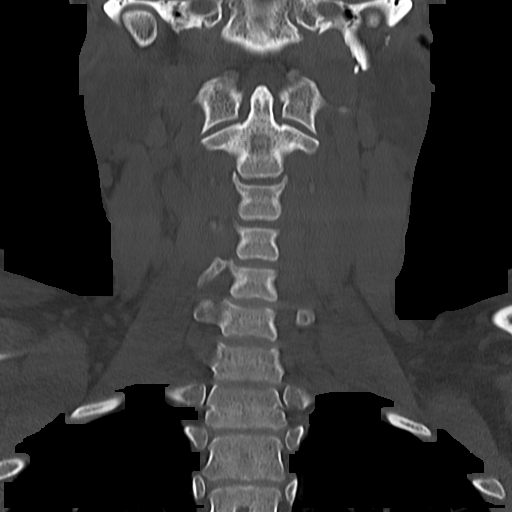
[im 28/46  bone]
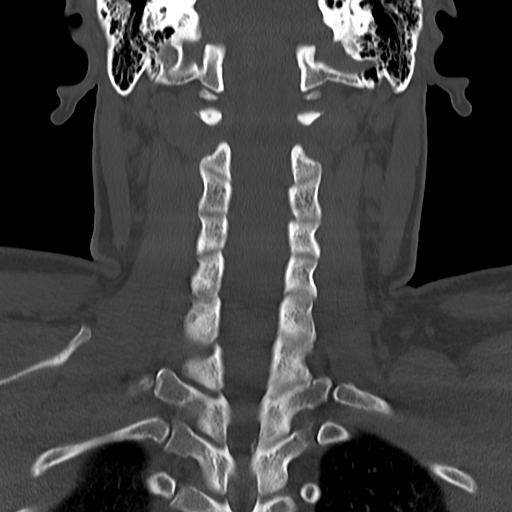

[Series 604: orthog bone · axial · 0.39mm/px · z∈[-281,-230]mm · 3 of 86 slices shown]
[im 15/86  bone]
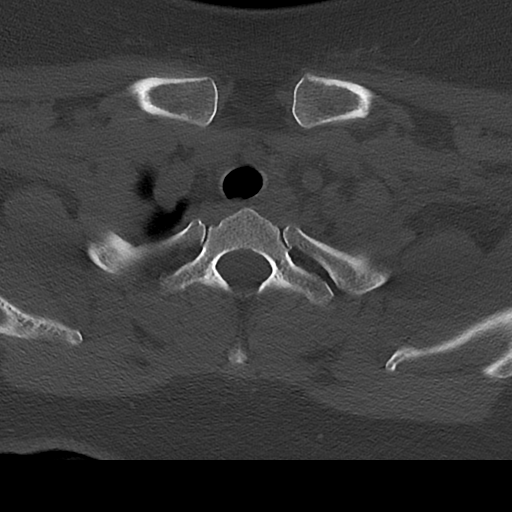
[im 29/86  bone]
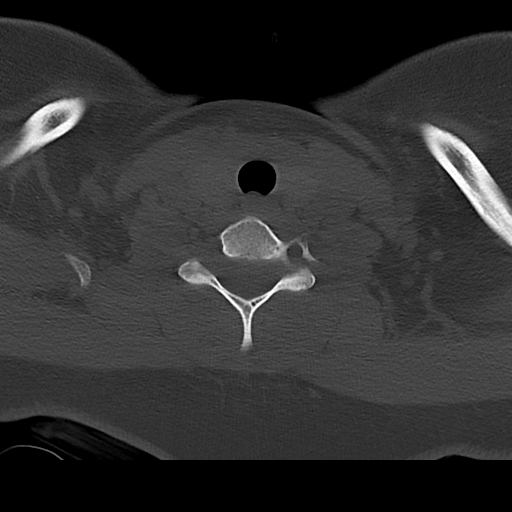
[im 43/86  bone]
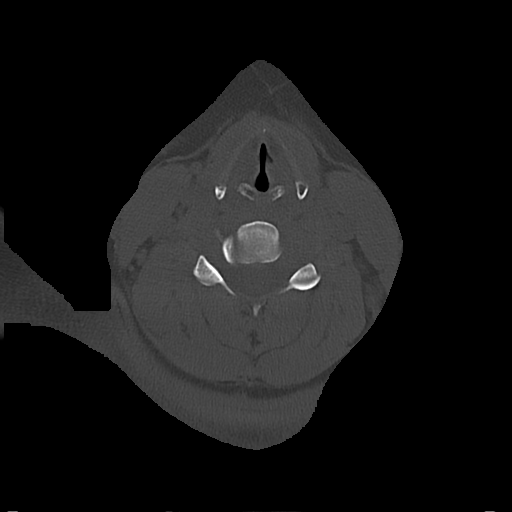

[16 of 33 positions shown; findings below may reference images not displayed]

FINDINGS: There is no fracture or spondylolisthesis. Prevertebral soft tissues
and predental space regions are normal. There is no appreciable disc
space narrowing.

At C2-3, there is no nerve root edema or effacement. No disc
extrusion or stenosis.

At C3-4, there is no nerve root edema or effacement. No disc
extrusion or stenosis.

At C4-5, there is no nerve root edema or effacement. No disc
extrusion or stenosis.

At C5-6, there is no nerve root edema or effacement. No disc
extrusion or stenosis. There is minimal facet hypertrophy on the
left at this level.

At C6-7, there is minimal facet hypertrophy bilaterally. No nerve
root edema or effacement. No disc extrusion or stenosis.

At C7-T1, there is no nerve root edema or effacement. No disc
extrusion or stenosis.

There are no appreciable paraspinous lesions.
IMPRESSION: No nerve root edema or effacement. No disc extrusion or stenosis. No
fracture or spondylolisthesis.

## 2019-09-09 ENCOUNTER — Other Ambulatory Visit: Payer: Self-pay

## 2019-09-09 DIAGNOSIS — Z20822 Contact with and (suspected) exposure to covid-19: Secondary | ICD-10-CM

## 2019-09-10 LAB — NOVEL CORONAVIRUS, NAA: SARS-CoV-2, NAA: NOT DETECTED

## 2019-09-17 ENCOUNTER — Telehealth: Payer: Self-pay | Admitting: General Practice

## 2019-09-17 NOTE — Telephone Encounter (Signed)
Pt called to get COVID results, made her aware those are negative. °

## 2020-06-27 ENCOUNTER — Other Ambulatory Visit: Payer: Self-pay

## 2020-06-27 ENCOUNTER — Encounter: Payer: Self-pay | Admitting: Family Medicine

## 2020-06-27 ENCOUNTER — Ambulatory Visit: Payer: 59 | Admitting: Family Medicine

## 2020-06-27 VITALS — BP 102/75 | HR 79 | Ht 67.0 in | Wt 227.0 lb

## 2020-06-27 DIAGNOSIS — Z7689 Persons encountering health services in other specified circumstances: Secondary | ICD-10-CM

## 2020-06-27 NOTE — Assessment & Plan Note (Addendum)
Reviewed past medical, surgical, and social history.  Medications updated in epic.  Health maintenance reviewed and due to Tdap/HIV, however will postpone per patient's preference. Encouraged working towards a well-balanced diet and staying physically active with frequent small bouts of exercise to fit into her busy schedule and establishing SMART goals.

## 2020-06-27 NOTE — Progress Notes (Signed)
    SUBJECTIVE:   CHIEF COMPLAINT / HPI: Establish care   Sonya Phillips is a 39 year old female presenting to establish care.  She has no concerns today.  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  Previous smoker, quit in 2017, smoked for approximately 8-10 years total.  Denies any illicit drug or alcohol use.  Does not feel at risk for STDs and does not want to be screened.  Feels safe in her relationships.  Exercises regularly, trying to lose weight.  Enjoy spending time with her children for fun.  Past medical history: Elevated BMI.  3 previous SVD's without concern.  Has had seasonal allergies.  Working with [facility] for weight loss, had normal A1c/insulin resistant screening recently through them.  Family history: Type 2 diabetes on paternal side, history of cancer within her uncle (she is not sure, maybe pancreatic).  No family history of breast or colon cancer.  Past surgical history: Tubal ligation in 2018.  History of D&C for miscarriage in 2014.  Health maintenance: Already received Covid vaccinations.  Last Pap normal in 2019, follows with OB/GYN.  Due for Tdap, would like to postpone this.  OBJECTIVE:   BP 102/75   Pulse 79   Ht 5\' 7"  (1.702 m)   Wt 227 lb (103 kg)   LMP 06/10/2020 (Exact Date)   SpO2 100%   BMI 35.55 kg/m   General: Alert, NAD HEENT: NCAT, MMM, oropharynx nonerythematous  Cardiac: RRR  Lungs: Clear bilaterally, no increased WOB  Abdomen: soft, non-tender, non-distended, normoactive BS Msk: Moves all extremities spontaneously  Ext: Warm, dry, 2+ distal pulses, no edema  Psych: Normal mood and affect, pleasant and engaging in conversation\  ASSESSMENT/PLAN:   Encounter to establish care with new doctor Reviewed past medical, surgical, and social history.  Medications  updated in epic.  Health maintenance reviewed and due to Tdap/HIV, however will postpone per patient's preference. Encouraged working towards a well-balanced diet and staying physically active with frequent small bouts of exercise to fit into her busy schedule and establishing SMART goals.     Follow-up in 1 year or sooner if needed.  06/12/2020, DO [place] [facility]

## 2020-06-27 NOTE — Patient Instructions (Signed)
Wonderful to see you today.  Please follow-up in 1 year or sooner if needed for anything.  Good luck on your weight loss journey, encourage you to set smart goals and stay physically active as you can.

## 2020-06-28 ENCOUNTER — Encounter: Payer: Self-pay | Admitting: Family Medicine

## 2022-01-18 ENCOUNTER — Ambulatory Visit: Payer: 59 | Admitting: Family Medicine

## 2022-01-18 ENCOUNTER — Other Ambulatory Visit: Payer: Self-pay

## 2022-01-18 VITALS — BP 117/85 | HR 94 | Wt 209.2 lb

## 2022-01-18 DIAGNOSIS — L720 Epidermal cyst: Secondary | ICD-10-CM

## 2022-01-18 NOTE — Patient Instructions (Signed)
You had a cyst removed today. Please keep the bandage on. When you remove it  wash with soap and water. It should heal from the bottom up. If you notice any pus or spreading redness, please come back. You may cover it with a bandaid and use ointment like vaseline or aquaphor.

## 2022-01-18 NOTE — Progress Notes (Signed)
° ° °  SUBJECTIVE:   CHIEF COMPLAINT / HPI:   Cyst on shoulder: patient presents today with concern for "bump" on her left shoulder above the clavicle that has been there for many years. It was small until the last week or so, when it became larger, tender, and bothersome. Patient would like it removed today. No fevers or chills.  PERTINENT  PMH / PSH: non-contributory  OBJECTIVE:   BP 117/85    Pulse 94    Wt 209 lb 3.2 oz (94.9 kg)    SpO2 100%    BMI 32.77 kg/m   Nursing note and vitals reviewed GEN: age-appropriate, AAW, resting comfortably in chair, NAD, WNWD HEENT: NCAT. PERRLA. Sclera without injection or icterus. MMM.  Skin: 1.5 cm nodule with surrounding erythema on left anterior shoulder with cheeselike material extruding Ext: no edema Psych: Pleasant and appropriate   ASSESSMENT/PLAN:   Epidermoid cyst of skin of shoulder Procedure: epidermoid cyst removal Consent signed and scanned into record. Medication:  3 cc Lidocaine 1% with epi Preparation: area cleansed with alcohol  Time Out taken  Injection  Landmarks identified 3 cc of medication injected into joint space using a circumferential block approach 64mm punch biopsy used, extracted cheeselike contents and bulk of cyst wall, which was placed in formalin bottle Patient tolerated well without bleeding. Cleansed and bandage placed. Aftercare instructions given. Will send to pathology.      Shirlean Mylar, MD [facility]

## 2022-01-18 NOTE — Assessment & Plan Note (Signed)
Procedure: epidermoid cyst removal Consent signed and scanned into record. Medication:  3 cc Lidocaine 1% with epi Preparation: area cleansed with alcohol  Time Out taken  Injection  Landmarks identified 3 cc of medication injected into joint space using a circumferential block approach 29mm punch biopsy used, extracted cheeselike contents and bulk of cyst wall, which was placed in formalin bottle Patient tolerated well without bleeding. Cleansed and bandage placed. Aftercare instructions given. Will send to pathology.

## 2022-01-22 ENCOUNTER — Encounter (HOSPITAL_BASED_OUTPATIENT_CLINIC_OR_DEPARTMENT_OTHER): Payer: Self-pay | Admitting: Family Medicine

## 2022-04-15 NOTE — Progress Notes (Signed)
    SUBJECTIVE:   CHIEF COMPLAINT / HPI: weight loss assistance   Patient presents today to discuss options to help with weight loss.  She states that she has tried intermittent fasting, weight watchers, and calorie counting with success in the past but has not been able to have success with this with her recent attempt over the last 6 months.  She reports that she usually has weight gain with pregnancies and then is able to lose weight after her pregnancy.  She states that she does not desire to have any more children.  Patient was able to last reach her goal weight in 2020 which was 185 pounds.  Since then she is staying above 200 pounds.  Patient states that she is interested in using medication therapy to help with weight loss.  Patient states that she has a prescription card to cover [facility] for 1 month.  Patient states that she called her insurance company and the options listed for weight loss would be Trulicity, Ozempic, Victoza or Mounjaro.   PERTINENT  PMH / PSH:  Obesity, BMI 33   OBJECTIVE:   BP 120/81   Pulse 93   Ht 5\' 7"  (1.702 m)   Wt 216 lb (98 kg)   LMP 04/19/2022 (Exact Date)   Breastfeeding No   BMI 33.83 kg/m   Physical Exam Constitutional:      General: She is not in acute distress.    Appearance: She is not ill-appearing.  HENT:     Right Ear: External ear normal.     Left Ear: External ear normal.     Nose: Nose normal.     Mouth/Throat:     Pharynx: Oropharynx is clear.  Cardiovascular:     Rate and Rhythm: Normal rate and regular rhythm.  Pulmonary:     Effort: Pulmonary effort is normal.  Abdominal:     General: Bowel sounds are normal.  Musculoskeletal:     Cervical back: Normal range of motion.  Skin:    General: Skin is warm.  Neurological:     General: No focal deficit present.     Mental Status: She is alert and oriented to person, place, and time.    ASSESSMENT/PLAN:   BMI 33.0-33.9,adult We will check lipid panel, BMP and hemoglobin  A1c given elevated BMI We will start semaglutide for weight management at 0.25 mg once weekly for 4 weeks Patient states that she has prescription card to cover cost of prescription Patient advised to contact myself via MyChart if unable to obtain this prescription and we will use other options including Ozempic, Trulicity or Victoza, patient was in agreement with this plan     05-05-1983, MD [facility]

## 2022-04-19 ENCOUNTER — Ambulatory Visit: Payer: 59 | Admitting: Family Medicine

## 2022-04-19 ENCOUNTER — Encounter: Payer: Self-pay | Admitting: Family Medicine

## 2022-04-19 VITALS — BP 120/81 | HR 93 | Ht 67.0 in | Wt 216.0 lb

## 2022-04-19 DIAGNOSIS — Z6833 Body mass index (BMI) 33.0-33.9, adult: Secondary | ICD-10-CM

## 2022-04-19 DIAGNOSIS — Z6834 Body mass index (BMI) 34.0-34.9, adult: Secondary | ICD-10-CM | POA: Insufficient documentation

## 2022-04-19 MED ORDER — SEMAGLUTIDE-WEIGHT MANAGEMENT 0.25 MG/0.5ML ~~LOC~~ SOAJ: 0.2500 mg | SUBCUTANEOUS | 0 refills | Status: AC

## 2022-04-19 NOTE — Patient Instructions (Signed)
I will print a prescription for [facility].  I recommend if you are able to start this that you follow-up with me in 2 weeks.  Today we will collect blood work to check your cholesterol, kidney enzymes, and electrolytes.  We will also check your hemoglobin A1c to evaluate for prediabetes or diabetes.  I will follow-up with you regarding any abnormal results.  Please either send me a message via MyChart or call our office to let us know if you are unable to get the [facility] prescription and we can try either the Mounjaro, Trulicity, Ozempic or Victoza prescriptions.

## 2022-04-19 NOTE — Assessment & Plan Note (Signed)
We will check lipid panel, BMP and hemoglobin A1c given elevated BMI We will start semaglutide for weight management at 0.25 mg once weekly for 4 weeks Patient states that she has prescription card to cover cost of prescription Patient advised to contact myself via MyChart if unable to obtain this prescription and we will use other options including Ozempic, Trulicity or Victoza, patient was in agreement with this plan

## 2022-04-20 LAB — BASIC METABOLIC PANEL

## 2022-04-20 LAB — LIPID PANEL

## 2022-04-20 LAB — HEMOGLOBIN A1C: Hgb A1c MFr Bld: 5.2 % (ref 4.8–5.6)

## 2022-04-24 LAB — BASIC METABOLIC PANEL
BUN/Creatinine Ratio: 18 (ref 9–23)
BUN: 11 mg/dL (ref 6–24)
Chloride: 105 mmol/L (ref 96–106)
Glucose: 99 mg/dL (ref 70–99)
Potassium: 3.9 mmol/L (ref 3.5–5.2)
Sodium: 141 mmol/L (ref 134–144)
eGFR: 114 mL/min/{1.73_m2} (ref >59)

## 2022-04-24 LAB — HEMOGLOBIN A1C: Est. average glucose Bld gHb Est-mCnc: 103 mg/dL

## 2022-04-24 LAB — LIPID PANEL
Chol/HDL Ratio: 3.8 ratio (ref 0.0–4.4)
Cholesterol, Total: 177 mg/dL (ref 100–199)
VLDL Cholesterol Cal: 46 mg/dL — ABNORMAL HIGH (ref 5–40)

## 2022-04-30 ENCOUNTER — Encounter: Payer: Self-pay | Admitting: *Deleted

## 2023-03-05 NOTE — Progress Notes (Signed)
    SUBJECTIVE:   Chief compliant/HPI: annual examination  Sonya Phillips is a 42 y.o. who presents today for an annual exam.   Weight loss? Lipid panel* Pap normal in 2019 - due? Hep C screening   History tabs reviewed and updated ***.   Review of systems form reviewed and notable for ***.   OBJECTIVE:   There were no vitals taken for this visit.  ***  ASSESSMENT/PLAN:   No problem-specific Assessment & Plan notes found for this encounter.    Annual Examination  See AVS for age appropriate recommendations.   PHQ score ***, reviewed and discussed.  Blood pressure reviewed and at goal ***.  Asked about intimate partner violence and resources given as appropriate  The patient currently uses *** for contraception. Folate recommended as appropriate, minimum of 400 mcg per day.   Considered the following items based upon USPSTF recommendations: Diabetes screening: {discussed/ordered:14545} Screening for elevated cholesterol: {discussed/ordered:14545} HIV testing: {discussed/ordered:14545} Hepatitis C: {discussed/ordered:14545} Hepatitis B: {discussed/ordered:14545} Syphilis if at high risk: {discussed/ordered:14545} GC/CT {GC/CT screening :23818} Reviewed risk factors for latent tuberculosis and {not indicated/requested/declined:14582} Reviewed risk factors for osteoporosis. Using FRAX tool estimated risk of major osteoporotic fracture of  ***%, early screening {ordered not order:23822::"not ordered"}   Discussed family history, BRCA testing {not indicated/requested/declined:14582}. Tool used to risk stratify was ***.  Cervical cancer screening: {PAPTYPE:23819} Breast cancer screening: {mammoscreen:23820} Colorectal cancer screening: {crcscreen:23821::"discussed, colonoscopy ordered"} if age 91 or over.   Follow up in 1 *** year or sooner if indicated.    Elberta Fortis, MD [facility]

## 2023-03-06 ENCOUNTER — Encounter: Payer: Self-pay | Admitting: Family Medicine

## 2023-03-06 ENCOUNTER — Ambulatory Visit (INDEPENDENT_AMBULATORY_CARE_PROVIDER_SITE_OTHER): Payer: 59 | Admitting: Family Medicine

## 2023-03-06 VITALS — BP 115/75 | HR 77 | Ht 67.0 in | Wt 220.8 lb

## 2023-03-06 DIAGNOSIS — R5383 Other fatigue: Secondary | ICD-10-CM

## 2023-03-06 DIAGNOSIS — Z833 Family history of diabetes mellitus: Secondary | ICD-10-CM

## 2023-03-06 DIAGNOSIS — R635 Abnormal weight gain: Secondary | ICD-10-CM

## 2023-03-06 DIAGNOSIS — E6609 Other obesity due to excess calories: Secondary | ICD-10-CM

## 2023-03-06 DIAGNOSIS — Z6834 Body mass index (BMI) 34.0-34.9, adult: Secondary | ICD-10-CM

## 2023-03-06 DIAGNOSIS — Z0000 Encounter for general adult medical examination without abnormal findings: Secondary | ICD-10-CM | POA: Diagnosis not present

## 2023-03-06 DIAGNOSIS — Z1159 Encounter for screening for other viral diseases: Secondary | ICD-10-CM | POA: Diagnosis not present

## 2023-03-06 MED ORDER — SEMAGLUTIDE(0.25 OR 0.5MG/DOS) 2 MG/1.5ML ~~LOC~~ SOPN: 0.2500 mg | PEN_INJECTOR | SUBCUTANEOUS | 3 refills | Status: DC

## 2023-03-06 NOTE — Patient Instructions (Addendum)
It was wonderful to see you today! Thank you for choosing [facility].   Please bring ALL of your medications with you to every visit.   Today we talked about:  I am sending in the weight loss medication. It will take some time to hear back from insurance and see if it is approved. I will follow up with you about the lab results. Please let me know if you have any concerns before then.  Please follow up in 1 year of annual physical  We are checking some labs today. If they are abnormal, I will call you. If they are normal, I will send you a MyChart message (if it is active) or a letter in the mail. If you do not hear about your labs in the next 2 weeks, please call the office.  Call the clinic at [phone] if your symptoms worsen or you have any concerns.  Please be sure to schedule follow up at the front desk before you leave today.   Elberta Fortis, DO Family Medicine    Blood Pressure Record Sheet To take your blood pressure, you will need a blood pressure machine. You can buy a blood pressure machine (blood pressure monitor) at your clinic, drug store, or online. When choosing one, consider: An automatic monitor that has an arm cuff. A cuff that wraps snugly around your upper arm. You should be able to fit only one finger between your arm and the cuff. A device that stores blood pressure reading results. Do not choose a monitor that measures your blood pressure from your wrist or finger. Follow your health care provider's instructions for how to take your blood pressure. To use this form: Take your blood pressure medications every day These measurements should be taken when you have been at rest for at least 10-15 min Take at least 2 readings with each blood pressure check. This makes sure the results are correct. Wait 1-2 minutes between measurements. Write down the results in the spaces on this form. Keep in mind it should always be recorded systolic over  diastolic. Both numbers are important.  Repeat this every day for 2-3 weeks, or as told by your health care provider.  Make a follow-up appointment with your health care provider to discuss the results.  Blood Pressure Log Date Medications taken? (Y/N) Blood Pressure Time of Day

## 2023-03-06 NOTE — Assessment & Plan Note (Addendum)
Reporting more daytime fatigue in the past year. Non-specific but does not endorse any associated symptoms. Regular exercise and sleeps fairly well. Fhx thyroid disorder in mother.  -TSH and CBC today -Consider sleep study given possible history of snoring

## 2023-03-06 NOTE — Assessment & Plan Note (Addendum)
Mild weight gain ~5 pounds since last physical in 03/2022. Reports difficulty losing weight. Prescribed Wegovy previously, unable to obtain due to supply issues. Tried diet and exercise extensively without success. -Prescribe GLP-1 for weight loss (has commercial insurance) -A1c, lipid panel, CMP today

## 2023-03-07 LAB — LIPID PANEL
Chol/HDL Ratio: 3.6 ratio (ref 0.0–4.4)
Cholesterol, Total: 161 mg/dL (ref 100–199)
HDL: 45 mg/dL (ref >39)
LDL Chol Calc (NIH): 92 mg/dL (ref 0–99)
Triglycerides: 133 mg/dL (ref 0–149)
VLDL Cholesterol Cal: 24 mg/dL (ref 5–40)

## 2023-03-07 LAB — HEMOGLOBIN A1C
Est. average glucose Bld gHb Est-mCnc: 103 mg/dL
Hgb A1c MFr Bld: 5.2 % (ref 4.8–5.6)

## 2023-03-07 LAB — CBC
Hematocrit: 40.7 % (ref 34.0–46.6)
Hemoglobin: 12.8 g/dL (ref 11.1–15.9)
MCH: 27.5 pg (ref 26.6–33.0)
MCHC: 31.4 g/dL — ABNORMAL LOW (ref 31.5–35.7)
MCV: 88 fL (ref 79–97)
Platelets: 225 10*3/uL (ref 150–450)
RBC: 4.65 x10E6/uL (ref 3.77–5.28)
RDW: 13.3 % (ref 11.7–15.4)
WBC: 6.2 10*3/uL (ref 3.4–10.8)

## 2023-03-07 LAB — HCV INTERPRETATION

## 2023-03-07 LAB — COMPREHENSIVE METABOLIC PANEL
ALT: 17 IU/L (ref 0–32)
AST: 17 IU/L (ref 0–40)
Albumin/Globulin Ratio: 1.8 (ref 1.2–2.2)
Albumin: 4.2 g/dL (ref 3.9–4.9)
Alkaline Phosphatase: 54 IU/L (ref 44–121)
BUN/Creatinine Ratio: 18 (ref 9–23)
BUN: 12 mg/dL (ref 6–24)
Bilirubin Total: 0.3 mg/dL (ref 0.0–1.2)
CO2: 23 mmol/L (ref 20–29)
Calcium: 9 mg/dL (ref 8.7–10.2)
Chloride: 105 mmol/L (ref 96–106)
Creatinine, Ser: 0.68 mg/dL (ref 0.57–1.00)
Globulin, Total: 2.4 g/dL (ref 1.5–4.5)
Glucose: 93 mg/dL (ref 70–99)
Potassium: 4.1 mmol/L (ref 3.5–5.2)
Sodium: 140 mmol/L (ref 134–144)
Total Protein: 6.6 g/dL (ref 6.0–8.5)
eGFR: 111 mL/min/{1.73_m2} (ref >59)

## 2023-03-07 LAB — HCV AB W REFLEX TO QUANT PCR: HCV Ab: NONREACTIVE

## 2023-03-07 LAB — TSH RFX ON ABNORMAL TO FREE T4: TSH: 0.843 u[IU]/mL (ref 0.450–4.500)

## 2023-03-20 ENCOUNTER — Other Ambulatory Visit: Payer: Self-pay | Admitting: Family Medicine

## 2023-03-20 ENCOUNTER — Telehealth: Payer: Self-pay

## 2023-03-20 DIAGNOSIS — R635 Abnormal weight gain: Secondary | ICD-10-CM

## 2023-03-20 DIAGNOSIS — E6609 Other obesity due to excess calories: Secondary | ICD-10-CM

## 2023-03-20 MED ORDER — SEMAGLUTIDE(0.25 OR 0.5MG/DOS) 2 MG/1.5ML ~~LOC~~ SOPN: 0.2500 mg | PEN_INJECTOR | SUBCUTANEOUS | 3 refills | Status: DC

## 2023-03-20 MED ORDER — SEMAGLUTIDE(0.25 OR 0.5MG/DOS) 2 MG/3ML ~~LOC~~ SOPN: 0.2500 mg | PEN_INJECTOR | SUBCUTANEOUS | 1 refills | Status: DC

## 2023-03-20 NOTE — Telephone Encounter (Signed)
Patient calls nurse line in regards to [facility] prescription.   The manufacturer is no longer dispensing 1.46ml pens.   Please resend for 3ml pen.   Will forward to provider.

## 2023-03-24 ENCOUNTER — Telehealth: Payer: Self-pay

## 2023-03-24 DIAGNOSIS — E669 Obesity, unspecified: Secondary | ICD-10-CM

## 2023-03-24 NOTE — Telephone Encounter (Signed)
A Prior Authorization was initiated for this patients OZEMPIC through CoverMyMeds.   Key: [id number]  Most likely not covered due to no dx of type 2 diabetes.

## 2023-03-25 NOTE — Telephone Encounter (Signed)
Prior Auth for patients medication OZEMPIC denied by [facility] via CoverMyMeds.   Reason:   CoverMyMeds Key: [id number]

## 2023-03-26 MED ORDER — SEMAGLUTIDE-WEIGHT MANAGEMENT 1 MG/0.5ML ~~LOC~~ SOAJ: 1.0000 mg | SUBCUTANEOUS | 1 refills | Status: AC

## 2023-03-26 NOTE — Telephone Encounter (Signed)
Ozempic denied. Will try to send prescription for [facility] for insurance approval.

## 2023-03-28 ENCOUNTER — Telehealth: Payer: Self-pay

## 2023-03-28 NOTE — Telephone Encounter (Signed)
A Prior Authorization was initiated for this patients WEGOVY through CoverMyMeds.   Key: Sonya Phillips

## 2023-03-31 ENCOUNTER — Encounter: Payer: Self-pay | Admitting: Family Medicine

## 2023-03-31 NOTE — Telephone Encounter (Signed)
Prior Auth for patients medication WEGOVY denied by [facility] via CoverMyMeds.   Reason: The requested medication and/or diagnosis are not a covered benefit and excluded from coverage in accordance with the terms and conditions of your plan benefit.   CoverMyMeds Key: [id number]

## 2024-03-09 NOTE — Progress Notes (Unsigned)
    SUBJECTIVE:   Chief compliant/HPI: annual examination  Sonya Phillips is a 43 y.o. who presents today for an annual exam.   Obesity Desires weight loss.  Previously tried obtain GLP-1 but unfortunately denied by insurance.  Has continued to try to lose weight with diet and exercise but has been unsuccessful.  Per chart review around 5 pounds weight gain since annual last year.  Difficulty with sugar craving.  Fatigue Endorses some generalized fatigue.  Attributes it to getting older and having a busy schedule and young child.  History of anemia, recently seen by OB/GYN but said she did not have labs done.  Would like to be checked again today.  Otherwise denies heavy bleeding endorses irregular.  Consistent with prior menopausal symptoms.  History of hypothyroidism in mother, would like to be checked.  No other red flag symptoms.  History tabs reviewed and updated.   OBJECTIVE:   BP 110/66   Pulse 85   Ht 5\' 7"  (1.702 m)   Wt 225 lb 4 oz (102.2 kg)   LMP 03/06/2024   SpO2 98%   BMI 35.28 kg/m    General: Well-appearing. Alert. NAD HEENT: Normocephalic. White sclera. No rhinorrhea or congestion CV: RRR without murmur Pulm: CTAB. Normal WOB on RA. No wheezing Abdomen: Soft, non-tender, non-distended. +BS Ext: Well perfused. Cap refill < 3 seconds Skin: Warm, dry. No rashes noted   ASSESSMENT/PLAN:   Assessment & Plan BMI 34.0-34.9,adult 5 pound weight gain from physical last year despite attempts at diet and exercise.  GLP-1 previously denied by insurance, discussed alternatives and patient opted to try Contrave given significant sweets craving.  A1c normal. -Start Contrave titration -F/u in 3 months to assess weight loss Fatigue, unspecified type Likely secondary to busy lifestyle, no red flag symptoms. H/o anemia although normal hemoglobin at physical in 02/2023, will repeat today at patient preference. -TSH and CBC  Annual Examination  See AVS for age appropriate  recommendations.   PHQ score 7, reviewed and discussed.  Blood pressure reviewed and at goal.  The patient currently uses BTL for contraception. Folate recommended as appropriate, minimum of 400 mcg per day.   Considered the following items based upon USPSTF recommendations: Diabetes screening: ordered Screening for elevated cholesterol: Normal in 2024 HIV testing: Declined Hepatitis C: Declined Hepatitis B: Declined Syphilis if at high risk: Declined GC/CT: Declined Reviewed risk factors for latent tuberculosis and not indicated Reviewed risk factors for osteoporosis. Early screening not ordered   Discussed family history, BRCA testing not indicated. Cervical cancer screening: normal in 01/2022 with OBGYN Breast cancer screening: normal in 02/2023 Colorectal cancer screening: discussed, colonoscopy ordered if age 4 or over (turns 43 in the next month)  Follow up in 1 year or sooner if indicated.    Jonne Netters, MD [facility]

## 2024-03-11 ENCOUNTER — Ambulatory Visit (INDEPENDENT_AMBULATORY_CARE_PROVIDER_SITE_OTHER): Payer: 59 | Admitting: Family Medicine

## 2024-03-11 ENCOUNTER — Encounter: Payer: Self-pay | Admitting: Family Medicine

## 2024-03-11 VITALS — BP 110/66 | HR 85 | Ht 67.0 in | Wt 225.2 lb

## 2024-03-11 DIAGNOSIS — Z6834 Body mass index (BMI) 34.0-34.9, adult: Secondary | ICD-10-CM | POA: Diagnosis not present

## 2024-03-11 DIAGNOSIS — Z1211 Encounter for screening for malignant neoplasm of colon: Secondary | ICD-10-CM

## 2024-03-11 DIAGNOSIS — R5383 Other fatigue: Secondary | ICD-10-CM

## 2024-03-11 LAB — POCT GLYCOSYLATED HEMOGLOBIN (HGB A1C): Hemoglobin A1C: 5.3 % (ref 4.0–5.6)

## 2024-03-11 MED ORDER — CONTRAVE 8-90 MG PO TB12: ORAL_TABLET | ORAL | 2 refills | Status: AC

## 2024-03-11 NOTE — Assessment & Plan Note (Signed)
 Likely secondary to busy lifestyle, no red flag symptoms. H/o anemia although normal hemoglobin at physical in 02/2023, will repeat today at patient preference. -TSH and CBC

## 2024-03-11 NOTE — Assessment & Plan Note (Signed)
 5 pound weight gain from physical last year despite attempts at diet and exercise.  GLP-1 previously denied by insurance, discussed alternatives and patient opted to try Contrave given significant sweets craving.  A1c normal. -Start Contrave titration -F/u in 3 months to assess weight loss

## 2024-03-11 NOTE — Patient Instructions (Signed)
 It was wonderful to see you today! Thank you for choosing [facility].   Please bring ALL of your medications with you to every visit.   Today we talked about:  Starting on the Contrave to help with weight loss.  It will be a increased tapered dose, you will start with 1 tablet in the morning for 1 week then increase to 1 tablet twice per day.  You will then increase to 2 tablets in the morning 1 tablet at night and then have a steady state dosing of 2 tablets twice per day.  Please try this for the next 3 months and see how it impacts your weight loss journey.  Please continue to focus on smaller portions of high-protein meals and reduce the amount of sweets including soda/juice. We are getting some blood work to check on your fatigue, I will follow-up with you regarding those results. I referred you to the GI doctor to have a colonoscopy done given you are about to be 43.  Our office will call you with the scheduling information.  Please follow up in 3 months to discuss weight loss and 1 year physical  If you haven't already, sign up for My Chart to have easy access to your labs results, and communication with your primary care physician.   We are checking some labs today. If they are abnormal, I will call you. If they are normal, I will send you a MyChart message (if it is active) or a letter in the mail. If you do not hear about your labs in the next 2 weeks, please call the office.  Call the clinic at [phone] if your symptoms worsen or you have any concerns.  Please be sure to schedule follow up at the front desk before you leave today.   Jonne Netters, DO Family Medicine

## 2024-03-12 ENCOUNTER — Encounter: Payer: Self-pay | Admitting: Family Medicine

## 2024-03-12 LAB — CBC
Hematocrit: 42.2 % (ref 34.0–46.6)
Hemoglobin: 13 g/dL (ref 11.1–15.9)
MCH: 27.7 pg (ref 26.6–33.0)
MCHC: 30.8 g/dL — ABNORMAL LOW (ref 31.5–35.7)
MCV: 90 fL (ref 79–97)
Platelets: 228 10*3/uL (ref 150–450)
RBC: 4.69 x10E6/uL (ref 3.77–5.28)
RDW: 13 % (ref 11.7–15.4)
WBC: 6.7 10*3/uL (ref 3.4–10.8)

## 2024-03-12 LAB — TSH: TSH: 1.29 u[IU]/mL (ref 0.450–4.500)
